# Patient Record
Sex: Female | Born: 1935 | Race: White | Hispanic: No | Marital: Single | Smoking: Never smoker
Health system: Southern US, Community
[De-identification: ages and names within clinical notes are randomized; demographics above are authoritative.]

## PROBLEM LIST (undated history)

## (undated) DIAGNOSIS — I10 Essential (primary) hypertension: Secondary | ICD-10-CM

## (undated) DIAGNOSIS — E119 Type 2 diabetes mellitus without complications: Secondary | ICD-10-CM

---

## 2013-01-01 ENCOUNTER — Inpatient Hospital Stay (HOSPITAL_COMMUNITY)
Admission: EM | Admit: 2013-01-01 | Discharge: 2013-01-06 | DRG: 956 | Disposition: A | Discharge status: Skilled Nursing Facility | Payer: Medicare Other | Attending: Internal Medicine | Admitting: Internal Medicine

## 2013-01-01 ENCOUNTER — Encounter (HOSPITAL_COMMUNITY): Payer: Self-pay | Admitting: Emergency Medicine

## 2013-01-01 ENCOUNTER — Emergency Department (HOSPITAL_COMMUNITY): Payer: Medicare Other

## 2013-01-01 DIAGNOSIS — S32509A Unspecified fracture of unspecified pubis, initial encounter for closed fracture: Secondary | ICD-10-CM | POA: Diagnosis present

## 2013-01-01 DIAGNOSIS — S72141P Displaced intertrochanteric fracture of right femur, subsequent encounter for closed fracture with malunion: Secondary | ICD-10-CM

## 2013-01-01 DIAGNOSIS — M6282 Rhabdomyolysis: Secondary | ICD-10-CM | POA: Diagnosis present

## 2013-01-01 DIAGNOSIS — Z66 Do not resuscitate: Secondary | ICD-10-CM | POA: Diagnosis present

## 2013-01-01 DIAGNOSIS — S72143A Displaced intertrochanteric fracture of unspecified femur, initial encounter for closed fracture: Secondary | ICD-10-CM

## 2013-01-01 DIAGNOSIS — R739 Hyperglycemia, unspecified: Secondary | ICD-10-CM | POA: Diagnosis present

## 2013-01-01 DIAGNOSIS — R300 Dysuria: Secondary | ICD-10-CM | POA: Diagnosis present

## 2013-01-01 DIAGNOSIS — M62838 Other muscle spasm: Secondary | ICD-10-CM | POA: Diagnosis not present

## 2013-01-01 DIAGNOSIS — D62 Acute posthemorrhagic anemia: Secondary | ICD-10-CM | POA: Diagnosis not present

## 2013-01-01 DIAGNOSIS — E119 Type 2 diabetes mellitus without complications: Secondary | ICD-10-CM | POA: Diagnosis present

## 2013-01-01 DIAGNOSIS — Z801 Family history of malignant neoplasm of trachea, bronchus and lung: Secondary | ICD-10-CM

## 2013-01-01 DIAGNOSIS — Z7982 Long term (current) use of aspirin: Secondary | ICD-10-CM

## 2013-01-01 DIAGNOSIS — S7290XA Unspecified fracture of unspecified femur, initial encounter for closed fracture: Secondary | ICD-10-CM

## 2013-01-01 DIAGNOSIS — E559 Vitamin D deficiency, unspecified: Secondary | ICD-10-CM | POA: Diagnosis present

## 2013-01-01 DIAGNOSIS — IMO0001 Reserved for inherently not codable concepts without codable children: Secondary | ICD-10-CM | POA: Diagnosis present

## 2013-01-01 DIAGNOSIS — S72141A Displaced intertrochanteric fracture of right femur, initial encounter for closed fracture: Secondary | ICD-10-CM

## 2013-01-01 DIAGNOSIS — W19XXXA Unspecified fall, initial encounter: Secondary | ICD-10-CM | POA: Diagnosis present

## 2013-01-01 DIAGNOSIS — Z79899 Other long term (current) drug therapy: Secondary | ICD-10-CM

## 2013-01-01 DIAGNOSIS — I10 Essential (primary) hypertension: Secondary | ICD-10-CM | POA: Diagnosis present

## 2013-01-01 DIAGNOSIS — D72829 Elevated white blood cell count, unspecified: Secondary | ICD-10-CM

## 2013-01-01 HISTORY — DX: Essential (primary) hypertension: I10

## 2013-01-01 HISTORY — DX: Type 2 diabetes mellitus without complications: E11.9

## 2013-01-01 LAB — TYPE AND SCREEN
ABO/RH(D): O POS
Antibody Screen: NEGATIVE

## 2013-01-01 LAB — BASIC METABOLIC PANEL
CO2: 21 mEq/L (ref 19–32)
Calcium: 8.8 mg/dL (ref 8.4–10.5)
Creatinine, Ser: 0.65 mg/dL (ref 0.50–1.10)
GFR calc Af Amer: 89 mL/min — ABNORMAL LOW (ref >90)
GFR calc non Af Amer: 76 mL/min — ABNORMAL LOW (ref >90)
Glucose, Bld: 231 mg/dL — ABNORMAL HIGH (ref 70–99)
Sodium: 138 mEq/L (ref 135–145)

## 2013-01-01 LAB — CBC WITH DIFFERENTIAL/PLATELET
Basophils Absolute: 0 10*3/uL (ref 0.0–0.1)
Basophils Relative: 0 % (ref 0–1)
Eosinophils Absolute: 0 10*3/uL (ref 0.0–0.7)
Eosinophils Relative: 0 % (ref 0–5)
HCT: 36.5 % (ref 36.0–46.0)
Lymphs Abs: 0.8 10*3/uL (ref 0.7–4.0)
MCH: 28.6 pg (ref 26.0–34.0)
MCV: 84.3 fL (ref 78.0–100.0)
Monocytes Absolute: 1.7 10*3/uL — ABNORMAL HIGH (ref 0.1–1.0)
Platelets: 232 10*3/uL (ref 150–400)
RBC: 4.33 MIL/uL (ref 3.87–5.11)
RDW: 14.2 % (ref 11.5–15.5)

## 2013-01-01 LAB — PROTIME-INR: INR: 1.11 (ref 0.00–1.49)

## 2013-01-01 MED ORDER — TETANUS-DIPHTH-ACELL PERTUSSIS 5-2.5-18.5 LF-MCG/0.5 IM SUSP
0.5000 mL | Freq: Once | INTRAMUSCULAR | Status: AC
  Administered 2013-01-02: 0.5 mL via INTRAMUSCULAR
  Filled 2013-01-01: qty 0.5

## 2013-01-01 NOTE — ED Notes (Addendum)
Per PT[facility], pt had an un-wittnessed fall at home with unknown downtime, pt has shortening and outward rotation of right leg. Pt has skin tear on right forearm, and small wound above her right eye.

## 2013-01-01 NOTE — ED Provider Notes (Signed)
CSN: [id number]     Arrival date & time 01/01/13  2224 History   First MD Initiated Contact with Patient 01/01/13 2244     Chief Complaint  Patient presents with  . Fall  . Hip Pain   (Consider location/radiation/quality/duration/timing/severity/associated sxs/prior Treatment) HPI Comments: Patient is an 79 year old female with history of diabetes, hypertension who presents today after a fall. She is brought in by EMS after her family found her on the ground. When they found her they report that she said "I just wanted to lay down and watch TV". She complains of no pain. The family last spoke to the patient on Monday when she was behaving normally. They were unable to contact her yesterday. She lives at home alone. She has baseline dementia and her family reports that she is acting at her normal mental status. Her family does not believe that her tetanus shot is up to date. She is currently taking no medications. She has had no real medical care in the past 2 years since she moved here from [place].   Patient is a 79 y.o. female presenting with fall and hip pain. The history is provided by the patient and a relative.  Fall  Hip Pain    History reviewed. No pertinent past medical history. History reviewed. No pertinent past surgical history. No family history on file. History  Substance Use Topics  . Smoking status: Never Smoker   . Smokeless tobacco: Not on file  . Alcohol Use: No   OB History   Grav Para Term Preterm Abortions TAB SAB Ect Mult Living                 Review of Systems  Unable to perform ROS: Dementia    Allergies  Review of patient's allergies indicates no known allergies.  Home Medications  No current outpatient prescriptions on file. BP 158/58  Pulse 86  Temp(Src) 97.5 F (36.4 C) (Oral)  Resp 18  SpO2 98% Physical Exam  Nursing note and vitals reviewed. Constitutional: She appears well-developed and well-nourished. No distress.  HENT:  Head:  Normocephalic. Head is with abrasion.  Right Ear: External ear normal.  Left Ear: External ear normal.  Nose: Nose normal.  Mouth/Throat: Uvula is midline and oropharynx is clear and moist. Mucous membranes are dry.  Dried blood on tongue. No laceration appreciated.  Poor dentition.  Abrasion to right occiput. Large mass to left mastoid area consistent with skin CA. Appears chronic. Scabbing top layer has been torn off and there is bleeding, which is controlled.  Patient is very hard of hearing.   Eyes: Conjunctivae and EOM are normal. Pupils are equal, round, and reactive to light.  Neck: Trachea normal, normal range of motion and phonation normal. No spinous process tenderness and no muscular tenderness present. No rigidity. Normal range of motion present.  Cardiovascular: Normal rate, regular rhythm, normal heart sounds, intact distal pulses and normal pulses.   Pulses:      Radial pulses are 2+ on the right side, and 2+ on the left side.       Dorsalis pedis pulses are 2+ on the right side, and 2+ on the left side.  Pulmonary/Chest: Effort normal and breath sounds normal. No stridor. No respiratory distress. She has no wheezes. She has no rales.  Abdominal: Soft. She exhibits no distension. There is no tenderness.  Musculoskeletal: Normal range of motion.       Legs: Right leg is externally rotated and shortened. TTP over  right hip.   Neurological: She is alert. Coordination normal.  Confused, but cooperative.   Skin: Skin is warm and dry. She is not diaphoretic. No erythema.  Psychiatric: She has a normal mood and affect. Her behavior is normal. Cognition and memory are impaired.  confabulates    ED Course  Procedures (including critical care time) Labs Review Labs Reviewed  CBC WITH DIFFERENTIAL - Abnormal; Notable for the following:    WBC 24.9 (*)    Neutrophils Relative % 90 (*)    Neutro Abs 22.4 (*)    Lymphocytes Relative 3 (*)    Monocytes Absolute 1.7 (*)    All  other components within normal limits  BASIC METABOLIC PANEL - Abnormal; Notable for the following:    Glucose, Bld 231 (*)    GFR calc non Af Amer 76 (*)    GFR calc Af Amer 89 (*)    All other components within normal limits  CK - Abnormal; Notable for the following:    Total CK 875 (*)    All other components within normal limits  URINALYSIS, ROUTINE W REFLEX MICROSCOPIC - Abnormal; Notable for the following:    APPearance CLOUDY (*)    Glucose, UA 500 (*)    Ketones, ur 40 (*)    All other components within normal limits  CULTURE, BLOOD (ROUTINE X 2)  CULTURE, BLOOD (ROUTINE X 2)  PROTIME-INR  HEMOGLOBIN A1C  TYPE AND SCREEN  ABO/RH   Imaging Review Dg Hip Complete Right  01/02/2013   CLINICAL DATA:  Fall with right hip pain.  EXAM: RIGHT HIP - COMPLETE 2+ VIEW  COMPARISON:  None.  FINDINGS: Moderate osteopenia. Degenerative irregularity of the symphysis pubis.  Comminuted inter trochanteric right femur fracture, with separate lesser trochanteric fracture fragment.  Possible minimally displaced right inferior pubic ramus fracture.  IMPRESSION: Comminuted intertrochanteric right femur fracture.  Possible right inferior pubic ramus minimally displaced fracture.   Electronically Signed   By: Jeronimo Greaves M.D.   On: 01/02/2013 00:27   Ct Head Wo Contrast  01/02/2013   CLINICAL DATA:  Fall at home.  Injury to right eye.  EXAM: CT HEAD WITHOUT CONTRAST  CT CERVICAL SPINE WITHOUT CONTRAST  TECHNIQUE: Multidetector CT imaging of the head and cervical spine was performed following the standard protocol without intravenous contrast. Multiplanar CT image reconstructions of the cervical spine were also generated.  COMPARISON:  None.  FINDINGS: CT HEAD FINDINGS  Sinuses/Soft tissues: Right supraorbital soft tissue injury. Normal appearance of the orbits and globes, without retrobulbar hemorrhage. There is also soft tissue swelling about the posterior vertex on image 56/series 5 and possible soft  tissue swelling about the high left frontal scalp on image 54/series 4.  No underlying orbital fracture. Hyperostosis frontalis interna. No skull fracture. Clear mastoid air cells. Mucosal thickening of ethmoid air cells.  Intracranial: Expected cerebral atrophy. No mass lesion, hemorrhage, hydrocephalus, acute infarct, intra-axial, or extra-axial fluid collection.  CT CERVICAL SPINE FINDINGS  Spinal visualization through the bottom of T3. Prevertebral soft tissues are within normal limits. No apical pneumothorax.  Nodule in the left parotid gland which measures 1.3 cm on image 42/series 7  Cervical spondylosis with areas of bilateral neural foraminal narrowing, primarily at C5-6.  Skull base intact. Maintenance of vertebral body height and alignment. Facets are well-aligned. Coronal reformats demonstrate a normal C1-C2 articulation.  IMPRESSION: 1. Right supraorbital and scalp soft tissue swelling without acute intracranial abnormality. 2. No acute fracture or subluxation within the  cervical spine. 3. A left parotid gland nodule which is indeterminate. Of questionable clinical significance, given patient age.   Electronically Signed   By: Jeronimo Greaves M.D.   On: 01/02/2013 00:25   Ct Cervical Spine Wo Contrast  01/02/2013   CLINICAL DATA:  Fall at home.  Injury to right eye.  EXAM: CT HEAD WITHOUT CONTRAST  CT CERVICAL SPINE WITHOUT CONTRAST  TECHNIQUE: Multidetector CT imaging of the head and cervical spine was performed following the standard protocol without intravenous contrast. Multiplanar CT image reconstructions of the cervical spine were also generated.  COMPARISON:  None.  FINDINGS: CT HEAD FINDINGS  Sinuses/Soft tissues: Right supraorbital soft tissue injury. Normal appearance of the orbits and globes, without retrobulbar hemorrhage. There is also soft tissue swelling about the posterior vertex on image 56/series 5 and possible soft tissue swelling about the high left frontal scalp on image 54/series  4.  No underlying orbital fracture. Hyperostosis frontalis interna. No skull fracture. Clear mastoid air cells. Mucosal thickening of ethmoid air cells.  Intracranial: Expected cerebral atrophy. No mass lesion, hemorrhage, hydrocephalus, acute infarct, intra-axial, or extra-axial fluid collection.  CT CERVICAL SPINE FINDINGS  Spinal visualization through the bottom of T3. Prevertebral soft tissues are within normal limits. No apical pneumothorax.  Nodule in the left parotid gland which measures 1.3 cm on image 42/series 7  Cervical spondylosis with areas of bilateral neural foraminal narrowing, primarily at C5-6.  Skull base intact. Maintenance of vertebral body height and alignment. Facets are well-aligned. Coronal reformats demonstrate a normal C1-C2 articulation.  IMPRESSION: 1. Right supraorbital and scalp soft tissue swelling without acute intracranial abnormality. 2. No acute fracture or subluxation within the cervical spine. 3. A left parotid gland nodule which is indeterminate. Of questionable clinical significance, given patient age.   Electronically Signed   By: Jeronimo Greaves M.D.   On: 01/02/2013 00:25   Dg Chest Portable 1 View  01/02/2013   CLINICAL DATA:  Fall with hip injury.  Leukocytosis.  EXAM: PORTABLE CHEST - 1 VIEW  COMPARISON:  None.  FINDINGS: Osteopenia.  Patient rotated to the right. Artifact over the upper chest. Mild cardiomegaly. No pleural effusion or pneumothorax. No lobar consolidation. Lower lobe and peripheral predominant interstitial opacities.  IMPRESSION: Lower lobe and peripheral predominant interstitial opacities, suspicious for interstitial lung disease, likely pulmonary fibrosis.  No acute superimposed process.   Electronically Signed   By: Jeronimo Greaves M.D.   On: 01/02/2013 01:53    EKG Interpretation    Date/Time:  Thursday January 02 2013 00:16:27 EST Ventricular Rate:  87 PR Interval:  175 QRS Duration: 119 QT Interval:  377 QTC Calculation: 453 R  Axis:   -35 Text Interpretation:  Sinus rhythm Atrial premature complex LVH with IVCD and secondary repol abnrm Left axis deviation No old tracing to compare Confirmed by [facility]  MD, DAVID ([id number]) on 01/02/2013 12:21:23 AM           Discussed case with Dr. Roda Shutters who will see patient in the am. Admit to medicine.   MDM  No diagnosis found.  Presented after fall.. The patient was down is unknown. Last seen well on Monday. For this reason a CK was obtained. CK is 875. Fluid resuscitation was given. Patient with comminuted right intertrochanteric femur fracture. Orthopedics was counseled. They will see the patient tomorrow morning. Patient is admitted to medicine. Admission is appreciated. Vital signs are stable at this time. Patient has no complaints. Dr. Preston Fleeting has evaluated this patient and  agrees with plan. Patient / Family / Caregiver informed of clinical course, understand medical decision-making process, and agree with plan.     Mora Bellman, PA-C 01/02/13 [phone]

## 2013-01-02 ENCOUNTER — Encounter (HOSPITAL_COMMUNITY)
Admission: EM | Disposition: A | Discharge status: Skilled Nursing Facility | Payer: Self-pay | Source: Home / Self Care | Attending: Internal Medicine

## 2013-01-02 ENCOUNTER — Inpatient Hospital Stay (HOSPITAL_COMMUNITY): Payer: Medicare Other

## 2013-01-02 ENCOUNTER — Encounter (HOSPITAL_COMMUNITY): Payer: Medicare Other | Admitting: Anesthesiology

## 2013-01-02 ENCOUNTER — Inpatient Hospital Stay (HOSPITAL_COMMUNITY): Payer: Medicare Other | Admitting: Anesthesiology

## 2013-01-02 ENCOUNTER — Encounter (HOSPITAL_COMMUNITY): Payer: Self-pay | Admitting: Radiology

## 2013-01-02 DIAGNOSIS — IMO0001 Reserved for inherently not codable concepts without codable children: Secondary | ICD-10-CM | POA: Diagnosis present

## 2013-01-02 DIAGNOSIS — S7290XA Unspecified fracture of unspecified femur, initial encounter for closed fracture: Secondary | ICD-10-CM

## 2013-01-02 DIAGNOSIS — D72829 Elevated white blood cell count, unspecified: Secondary | ICD-10-CM

## 2013-01-02 DIAGNOSIS — R739 Hyperglycemia, unspecified: Secondary | ICD-10-CM | POA: Diagnosis present

## 2013-01-02 DIAGNOSIS — R030 Elevated blood-pressure reading, without diagnosis of hypertension: Secondary | ICD-10-CM

## 2013-01-02 DIAGNOSIS — R7309 Other abnormal glucose: Secondary | ICD-10-CM

## 2013-01-02 DIAGNOSIS — S72143A Displaced intertrochanteric fracture of unspecified femur, initial encounter for closed fracture: Principal | ICD-10-CM

## 2013-01-02 HISTORY — PX: INTRAMEDULLARY (IM) NAIL INTERTROCHANTERIC: SHX5875

## 2013-01-02 LAB — URINALYSIS, ROUTINE W REFLEX MICROSCOPIC
Bilirubin Urine: NEGATIVE
Glucose, UA: 500 mg/dL — AB
Ketones, ur: 40 mg/dL — AB
Leukocytes, UA: NEGATIVE
Nitrite: NEGATIVE
Protein, ur: NEGATIVE mg/dL
Urobilinogen, UA: 0.2 mg/dL (ref 0.0–1.0)
pH: 5.5 (ref 5.0–8.0)

## 2013-01-02 LAB — COMPREHENSIVE METABOLIC PANEL
ALT: 17 U/L (ref 0–35)
AST: 36 U/L (ref 0–37)
Albumin: 2.9 g/dL — ABNORMAL LOW (ref 3.5–5.2)
Alkaline Phosphatase: 86 U/L (ref 39–117)
BUN: 12 mg/dL (ref 6–23)
Chloride: 107 mEq/L (ref 96–112)
GFR calc Af Amer: >90 mL/min (ref >90)
Glucose, Bld: 208 mg/dL — ABNORMAL HIGH (ref 70–99)
Potassium: 3.8 mEq/L (ref 3.5–5.1)
Sodium: 140 mEq/L (ref 135–145)
Total Bilirubin: 0.9 mg/dL (ref 0.3–1.2)
Total Protein: 6.6 g/dL (ref 6.0–8.3)

## 2013-01-02 LAB — HEMOGLOBIN A1C
Hgb A1c MFr Bld: 7.6 % — ABNORMAL HIGH (ref <5.7)
Mean Plasma Glucose: 171 mg/dL — ABNORMAL HIGH (ref <117)

## 2013-01-02 LAB — GLUCOSE, CAPILLARY
Glucose-Capillary: 202 mg/dL — ABNORMAL HIGH (ref 70–99)
Glucose-Capillary: 176 mg/dL — ABNORMAL HIGH (ref 70–99)
Glucose-Capillary: 212 mg/dL — ABNORMAL HIGH (ref 70–99)

## 2013-01-02 LAB — APTT: aPTT: 34 seconds (ref 24–37)

## 2013-01-02 LAB — SURGICAL PCR SCREEN: MRSA, PCR: NEGATIVE

## 2013-01-02 LAB — ABO/RH: ABO/RH(D): O POS

## 2013-01-02 SURGERY — FIXATION, FRACTURE, INTERTROCHANTERIC, WITH INTRAMEDULLARY ROD
Anesthesia: General | Site: Hip | Laterality: Right

## 2013-01-02 MED ORDER — SORBITOL 70 % SOLN: 30.0000 mL | Freq: Every day | Status: DC | PRN

## 2013-01-02 MED ORDER — METHOCARBAMOL 500 MG PO TABS
500.0000 mg | ORAL_TABLET | Freq: Four times a day (QID) | ORAL | Status: DC | PRN
  Administered 2013-01-03 – 2013-01-04 (×2): 500 mg via ORAL
  Filled 2013-01-02 (×2): qty 1

## 2013-01-02 MED ORDER — MENTHOL 3 MG MT LOZG: 1.0000 | LOZENGE | OROMUCOSAL | Status: DC | PRN

## 2013-01-02 MED ORDER — ONDANSETRON HCL 4 MG/2ML IJ SOLN
INTRAMUSCULAR | Status: DC | PRN
  Administered 2013-01-02: 4 mg via INTRAVENOUS

## 2013-01-02 MED ORDER — INSULIN ASPART 100 UNIT/ML ~~LOC~~ SOLN
0.0000 [IU] | Freq: Three times a day (TID) | SUBCUTANEOUS | Status: DC
  Administered 2013-01-02: 1 [IU] via SUBCUTANEOUS
  Administered 2013-01-02 – 2013-01-03 (×3): 2 [IU] via SUBCUTANEOUS
  Administered 2013-01-03: 3 [IU] via SUBCUTANEOUS
  Administered 2013-01-04: 1 [IU] via SUBCUTANEOUS

## 2013-01-02 MED ORDER — MORPHINE SULFATE 2 MG/ML IJ SOLN
0.5000 mg | INTRAMUSCULAR | Status: DC | PRN
  Administered 2013-01-03 (×2): 0.5 mg via INTRAVENOUS
  Filled 2013-01-02 (×2): qty 1

## 2013-01-02 MED ORDER — CEFAZOLIN SODIUM-DEXTROSE 2-3 GM-% IV SOLR
2.0000 g | Freq: Four times a day (QID) | INTRAVENOUS | Status: AC
  Administered 2013-01-02 – 2013-01-03 (×3): 2 g via INTRAVENOUS
  Filled 2013-01-02 (×3): qty 50

## 2013-01-02 MED ORDER — METHOCARBAMOL 100 MG/ML IJ SOLN
500.0000 mg | Freq: Four times a day (QID) | INTRAVENOUS | Status: DC | PRN
  Filled 2013-01-02: qty 5

## 2013-01-02 MED ORDER — FENTANYL CITRATE 0.05 MG/ML IJ SOLN
INTRAMUSCULAR | Status: DC | PRN
  Administered 2013-01-02 (×3): 50 ug via INTRAVENOUS

## 2013-01-02 MED ORDER — NEOSTIGMINE METHYLSULFATE 1 MG/ML IJ SOLN
INTRAMUSCULAR | Status: DC | PRN
  Administered 2013-01-02: 4 mg via INTRAVENOUS

## 2013-01-02 MED ORDER — SENNA 8.6 MG PO TABS
1.0000 | ORAL_TABLET | Freq: Two times a day (BID) | ORAL | Status: DC
  Administered 2013-01-02 – 2013-01-06 (×8): 8.6 mg via ORAL
  Filled 2013-01-02 (×9): qty 1

## 2013-01-02 MED ORDER — HYDROCODONE-ACETAMINOPHEN 5-325 MG PO TABS: 1.0000 | ORAL_TABLET | Freq: Four times a day (QID) | ORAL | Status: DC | PRN

## 2013-01-02 MED ORDER — HYDRALAZINE HCL 20 MG/ML IJ SOLN: 10.0000 mg | INTRAMUSCULAR | Status: DC | PRN

## 2013-01-02 MED ORDER — LIDOCAINE HCL (CARDIAC) 20 MG/ML IV SOLN
INTRAVENOUS | Status: DC | PRN
  Administered 2013-01-02: 20 mg via INTRAVENOUS

## 2013-01-02 MED ORDER — SODIUM CHLORIDE 0.9 % IV SOLN
INTRAVENOUS | Status: DC
  Administered 2013-01-02 – 2013-01-05 (×4): via INTRAVENOUS

## 2013-01-02 MED ORDER — DROPERIDOL 2.5 MG/ML IJ SOLN: 0.6250 mg | INTRAMUSCULAR | Status: DC | PRN

## 2013-01-02 MED ORDER — 0.9 % SODIUM CHLORIDE (POUR BTL) OPTIME
TOPICAL | Status: DC | PRN
  Administered 2013-01-02: 1000 mL

## 2013-01-02 MED ORDER — MORPHINE SULFATE 2 MG/ML IJ SOLN
0.5000 mg | INTRAMUSCULAR | Status: DC | PRN
  Administered 2013-01-02 (×2): 0.5 mg via INTRAVENOUS
  Filled 2013-01-02 (×2): qty 1

## 2013-01-02 MED ORDER — ASPIRIN EC 325 MG PO TBEC
325.0000 mg | DELAYED_RELEASE_TABLET | Freq: Two times a day (BID) | ORAL | Status: DC
  Administered 2013-01-02 – 2013-01-06 (×8): 325 mg via ORAL
  Filled 2013-01-02 (×10): qty 1

## 2013-01-02 MED ORDER — METOCLOPRAMIDE HCL 5 MG/ML IJ SOLN: 5.0000 mg | Freq: Three times a day (TID) | INTRAMUSCULAR | Status: DC | PRN

## 2013-01-02 MED ORDER — METOCLOPRAMIDE HCL 10 MG PO TABS: 5.0000 mg | ORAL_TABLET | Freq: Three times a day (TID) | ORAL | Status: DC | PRN

## 2013-01-02 MED ORDER — PHENOL 1.4 % MT LIQD: 1.0000 | OROMUCOSAL | Status: DC | PRN

## 2013-01-02 MED ORDER — CALCIUM CARBONATE-VITAMIN D 500-200 MG-UNIT PO TABS
1.0000 | ORAL_TABLET | Freq: Every day | ORAL | Status: DC
  Administered 2013-01-03 – 2013-01-06 (×4): 1 via ORAL
  Filled 2013-01-02 (×5): qty 1

## 2013-01-02 MED ORDER — MAGNESIUM CITRATE PO SOLN: 1.0000 | Freq: Once | ORAL | Status: AC | PRN

## 2013-01-02 MED ORDER — CEFAZOLIN SODIUM 1-5 GM-% IV SOLN
INTRAVENOUS | Status: DC | PRN
  Administered 2013-01-02: 2 g via INTRAVENOUS

## 2013-01-02 MED ORDER — FENTANYL CITRATE 0.05 MG/ML IJ SOLN
25.0000 ug | INTRAMUSCULAR | Status: DC | PRN
  Administered 2013-01-02: 25 ug via INTRAVENOUS

## 2013-01-02 MED ORDER — OXYCODONE HCL 5 MG PO TABS
5.0000 mg | ORAL_TABLET | ORAL | Status: DC | PRN
  Administered 2013-01-03: 10 mg via ORAL
  Filled 2013-01-02: qty 2

## 2013-01-02 MED ORDER — ONDANSETRON HCL 4 MG/2ML IJ SOLN: 4.0000 mg | Freq: Four times a day (QID) | INTRAMUSCULAR | Status: DC | PRN

## 2013-01-02 MED ORDER — GLYCOPYRROLATE 0.2 MG/ML IJ SOLN
INTRAMUSCULAR | Status: DC | PRN
  Administered 2013-01-02: .6 mg via INTRAVENOUS

## 2013-01-02 MED ORDER — ASPIRIN EC 325 MG PO TBEC: 325.0000 mg | DELAYED_RELEASE_TABLET | Freq: Two times a day (BID) | ORAL | Status: DC

## 2013-01-02 MED ORDER — PNEUMOCOCCAL VAC POLYVALENT 25 MCG/0.5ML IJ INJ
0.5000 mL | INJECTION | INTRAMUSCULAR | Status: AC
  Administered 2013-01-03: 0.5 mL via INTRAMUSCULAR
  Filled 2013-01-02: qty 0.5

## 2013-01-02 MED ORDER — SODIUM CHLORIDE 0.9 % IV BOLUS (SEPSIS): 1000.0000 mL | Freq: Once | INTRAVENOUS | Status: DC

## 2013-01-02 MED ORDER — PROPOFOL 10 MG/ML IV BOLUS
INTRAVENOUS | Status: DC | PRN
  Administered 2013-01-02: 130 mg via INTRAVENOUS

## 2013-01-02 MED ORDER — CEFAZOLIN SODIUM 1-5 GM-% IV SOLN
INTRAVENOUS | Status: AC
  Filled 2013-01-02: qty 100

## 2013-01-02 MED ORDER — ACETAMINOPHEN 650 MG RE SUPP: 650.0000 mg | Freq: Four times a day (QID) | RECTAL | Status: DC | PRN

## 2013-01-02 MED ORDER — ROCURONIUM BROMIDE 100 MG/10ML IV SOLN
INTRAVENOUS | Status: DC | PRN
  Administered 2013-01-02: 30 mg via INTRAVENOUS

## 2013-01-02 MED ORDER — ALUM & MAG HYDROXIDE-SIMETH 200-200-20 MG/5ML PO SUSP: 30.0000 mL | ORAL | Status: DC | PRN

## 2013-01-02 MED ORDER — MORPHINE SULFATE 2 MG/ML IJ SOLN: 0.5000 mg | INTRAMUSCULAR | Status: DC | PRN

## 2013-01-02 MED ORDER — LACTATED RINGERS IV SOLN
INTRAVENOUS | Status: DC | PRN
  Administered 2013-01-02 (×2): via INTRAVENOUS

## 2013-01-02 MED ORDER — POLYETHYLENE GLYCOL 3350 17 G PO PACK: 17.0000 g | PACK | Freq: Every day | ORAL | Status: DC | PRN

## 2013-01-02 MED ORDER — ENOXAPARIN SODIUM 40 MG/0.4ML ~~LOC~~ SOLN: 40.0000 mg | SUBCUTANEOUS | Status: DC

## 2013-01-02 MED ORDER — HYDROCODONE-ACETAMINOPHEN 5-325 MG PO TABS
1.0000 | ORAL_TABLET | Freq: Four times a day (QID) | ORAL | Status: DC | PRN
  Administered 2013-01-03 – 2013-01-04 (×2): 2 via ORAL
  Filled 2013-01-02 (×2): qty 2

## 2013-01-02 MED ORDER — ONDANSETRON HCL 4 MG PO TABS: 4.0000 mg | ORAL_TABLET | Freq: Four times a day (QID) | ORAL | Status: DC | PRN

## 2013-01-02 MED ORDER — SODIUM CHLORIDE 0.9 % IV BOLUS (SEPSIS)
1000.0000 mL | Freq: Once | INTRAVENOUS | Status: AC
  Administered 2013-01-02: 1000 mL via INTRAVENOUS

## 2013-01-02 MED ORDER — FENTANYL CITRATE 0.05 MG/ML IJ SOLN
INTRAMUSCULAR | Status: AC
  Filled 2013-01-02: qty 2

## 2013-01-02 MED ORDER — SODIUM CHLORIDE 0.9 % IV SOLN
INTRAVENOUS | Status: AC
  Administered 2013-01-02: 04:00:00 via INTRAVENOUS

## 2013-01-02 MED ORDER — ACETAMINOPHEN 325 MG PO TABS
650.0000 mg | ORAL_TABLET | Freq: Four times a day (QID) | ORAL | Status: DC | PRN
  Administered 2013-01-06: 650 mg via ORAL
  Filled 2013-01-02: qty 2

## 2013-01-02 MED ORDER — ARTIFICIAL TEARS OP OINT
TOPICAL_OINTMENT | OPHTHALMIC | Status: DC | PRN
  Administered 2013-01-02: 1 via OPHTHALMIC

## 2013-01-02 SURGICAL SUPPLY — 45 items
BANDAGE GAUZE ELAST BULKY 4 IN (GAUZE/BANDAGES/DRESSINGS) ×2 IMPLANT
BLADE SURG 15 STRL LF DISP TIS (BLADE) ×1 IMPLANT
BLADE SURG 15 STRL SS (BLADE) ×1
BNDG COHESIVE 4X5 TAN NS LF (GAUZE/BANDAGES/DRESSINGS) ×2 IMPLANT
CLOTH BEACON ORANGE TIMEOUT ST (SAFETY) ×2 IMPLANT
COVER SURGICAL LIGHT HANDLE (MISCELLANEOUS) ×2 IMPLANT
DRAPE PROXIMA HALF (DRAPES) ×4 IMPLANT
DRAPE STERI IOBAN 125X83 (DRAPES) ×2 IMPLANT
DRSG MEPILEX BORDER 4X4 (GAUZE/BANDAGES/DRESSINGS) IMPLANT
DRSG MEPILEX BORDER 4X8 (GAUZE/BANDAGES/DRESSINGS) ×2 IMPLANT
DRSG PAD ABDOMINAL 8X10 ST (GAUZE/BANDAGES/DRESSINGS) ×4 IMPLANT
DURAPREP 26ML APPLICATOR (WOUND CARE) ×2 IMPLANT
ELECT REM PT RETURN 9FT ADLT (ELECTROSURGICAL) ×2
ELECTRODE REM PT RTRN 9FT ADLT (ELECTROSURGICAL) ×1 IMPLANT
FACESHIELD LNG OPTICON STERILE (SAFETY) ×2 IMPLANT
GAUZE XEROFORM 5X9 LF (GAUZE/BANDAGES/DRESSINGS) ×2 IMPLANT
GLOVE SURG SS PI 7.5 STRL IVOR (GLOVE) ×4 IMPLANT
GOWN STRL NON-REIN LRG LVL3 (GOWN DISPOSABLE) ×2 IMPLANT
GOWN STRL REIN XL XLG (GOWN DISPOSABLE) ×2 IMPLANT
GUIDE PIN 3.2 LONG (PIN) ×2 IMPLANT
GUIDE PIN 3.2MM (MISCELLANEOUS) ×1
GUIDE PIN ORTH 343X3.2XBRAD (MISCELLANEOUS) ×1 IMPLANT
GUIDE ROD 3.0 (MISCELLANEOUS) ×2
HIP SCREW SET (Screw) ×2 IMPLANT
KIT BASIN OR (CUSTOM PROCEDURE TRAY) ×2 IMPLANT
KIT ROOM TURNOVER OR (KITS) ×2 IMPLANT
MANIFOLD NEPTUNE II (INSTRUMENTS) ×2 IMPLANT
NAIL IMHS 12MX36CM-130 (Nail) ×2 IMPLANT
NS IRRIG 1000ML POUR BTL (IV SOLUTION) ×2 IMPLANT
PACK GENERAL/GYN (CUSTOM PROCEDURE TRAY) ×2 IMPLANT
PAD ARMBOARD 7.5X6 YLW CONV (MISCELLANEOUS) ×4 IMPLANT
PAD CAST 4YDX4 CTTN HI CHSV (CAST SUPPLIES) ×2 IMPLANT
PADDING CAST COTTON 4X4 STRL (CAST SUPPLIES) ×2
ROD GUIDE 3.0 (MISCELLANEOUS) ×1 IMPLANT
SCREW COMPRESSION (Screw) ×2 IMPLANT
SCREW LAG 95MM (Screw) ×1 IMPLANT
SCREW LAGSTD 95X21X12.7X9 (Screw) ×1 IMPLANT
STAPLER VISISTAT 35W (STAPLE) ×2 IMPLANT
SUT VIC AB 0 CT1 27 (SUTURE) ×2
SUT VIC AB 0 CT1 27XBRD ANBCTR (SUTURE) ×2 IMPLANT
SUT VIC AB 2-0 CT1 27 (SUTURE) ×2
SUT VIC AB 2-0 CT1 TAPERPNT 27 (SUTURE) ×2 IMPLANT
TOWEL OR 17X24 6PK STRL BLUE (TOWEL DISPOSABLE) ×2 IMPLANT
TOWEL OR 17X26 10 PK STRL BLUE (TOWEL DISPOSABLE) ×2 IMPLANT
WATER STERILE IRR 1000ML POUR (IV SOLUTION) ×2 IMPLANT

## 2013-01-02 NOTE — Consult Note (Signed)
ORTHOPAEDIC CONSULTATION  REQUESTING PHYSICIAN: Eddie North, MD  Chief Complaint: Right hip pain  HPI: Kaitlin Brennan is a 79 y.o. female who complains of right hip fracture s/p being found on the floor by son.  She has a significant laceration to the back of the head.  Denies LOC.  Past Medical History  Diagnosis Date  . Diabetes mellitus without complication   . Hypertension    History reviewed. No pertinent past surgical history. History   Social History  . Marital Status: Single    Spouse Name: N/A    Number of Children: N/A  . Years of Education: N/A   Social History Main Topics  . Smoking status: Never Smoker   . Smokeless tobacco: None  . Alcohol Use: No  . Drug Use: No  . Sexual Activity: No   Other Topics Concern  . None   Social History Narrative  . None   Family History  Problem Relation Age of Onset  . Lung cancer Father    No Known Allergies Prior to Admission medications   Not on File   Dg Hip Complete Right  01/02/2013   CLINICAL DATA:  Fall with right hip pain.  EXAM: RIGHT HIP - COMPLETE 2+ VIEW  COMPARISON:  None.  FINDINGS: Moderate osteopenia. Degenerative irregularity of the symphysis pubis.  Comminuted inter trochanteric right femur fracture, with separate lesser trochanteric fracture fragment.  Possible minimally displaced right inferior pubic ramus fracture.  IMPRESSION: Comminuted intertrochanteric right femur fracture.  Possible right inferior pubic ramus minimally displaced fracture.   Electronically Signed   By: Jeronimo Greaves M.D.   On: 01/02/2013 00:27   Ct Head Wo Contrast  01/02/2013   CLINICAL DATA:  Fall at home.  Injury to right eye.  EXAM: CT HEAD WITHOUT CONTRAST  CT CERVICAL SPINE WITHOUT CONTRAST  TECHNIQUE: Multidetector CT imaging of the head and cervical spine was performed following the standard protocol without intravenous contrast. Multiplanar CT image reconstructions of the cervical spine were also generated.   COMPARISON:  None.  FINDINGS: CT HEAD FINDINGS  Sinuses/Soft tissues: Right supraorbital soft tissue injury. Normal appearance of the orbits and globes, without retrobulbar hemorrhage. There is also soft tissue swelling about the posterior vertex on image 56/series 5 and possible soft tissue swelling about the high left frontal scalp on image 54/series 4.  No underlying orbital fracture. Hyperostosis frontalis interna. No skull fracture. Clear mastoid air cells. Mucosal thickening of ethmoid air cells.  Intracranial: Expected cerebral atrophy. No mass lesion, hemorrhage, hydrocephalus, acute infarct, intra-axial, or extra-axial fluid collection.  CT CERVICAL SPINE FINDINGS  Spinal visualization through the bottom of T3. Prevertebral soft tissues are within normal limits. No apical pneumothorax.  Nodule in the left parotid gland which measures 1.3 cm on image 42/series 7  Cervical spondylosis with areas of bilateral neural foraminal narrowing, primarily at C5-6.  Skull base intact. Maintenance of vertebral body height and alignment. Facets are well-aligned. Coronal reformats demonstrate a normal C1-C2 articulation.  IMPRESSION: 1. Right supraorbital and scalp soft tissue swelling without acute intracranial abnormality. 2. No acute fracture or subluxation within the cervical spine. 3. A left parotid gland nodule which is indeterminate. Of questionable clinical significance, given patient age.   Electronically Signed   By: Jeronimo Greaves M.D.   On: 01/02/2013 00:25   Ct Cervical Spine Wo Contrast  01/02/2013   CLINICAL DATA:  Fall at home.  Injury to right eye.  EXAM: CT HEAD WITHOUT CONTRAST  CT CERVICAL  SPINE WITHOUT CONTRAST  TECHNIQUE: Multidetector CT imaging of the head and cervical spine was performed following the standard protocol without intravenous contrast. Multiplanar CT image reconstructions of the cervical spine were also generated.  COMPARISON:  None.  FINDINGS: CT HEAD FINDINGS  Sinuses/Soft tissues:  Right supraorbital soft tissue injury. Normal appearance of the orbits and globes, without retrobulbar hemorrhage. There is also soft tissue swelling about the posterior vertex on image 56/series 5 and possible soft tissue swelling about the high left frontal scalp on image 54/series 4.  No underlying orbital fracture. Hyperostosis frontalis interna. No skull fracture. Clear mastoid air cells. Mucosal thickening of ethmoid air cells.  Intracranial: Expected cerebral atrophy. No mass lesion, hemorrhage, hydrocephalus, acute infarct, intra-axial, or extra-axial fluid collection.  CT CERVICAL SPINE FINDINGS  Spinal visualization through the bottom of T3. Prevertebral soft tissues are within normal limits. No apical pneumothorax.  Nodule in the left parotid gland which measures 1.3 cm on image 42/series 7  Cervical spondylosis with areas of bilateral neural foraminal narrowing, primarily at C5-6.  Skull base intact. Maintenance of vertebral body height and alignment. Facets are well-aligned. Coronal reformats demonstrate a normal C1-C2 articulation.  IMPRESSION: 1. Right supraorbital and scalp soft tissue swelling without acute intracranial abnormality. 2. No acute fracture or subluxation within the cervical spine. 3. A left parotid gland nodule which is indeterminate. Of questionable clinical significance, given patient age.   Electronically Signed   By: Jeronimo Greaves M.D.   On: 01/02/2013 00:25   Dg Chest Portable 1 View  01/02/2013   CLINICAL DATA:  Fall with hip injury.  Leukocytosis.  EXAM: PORTABLE CHEST - 1 VIEW  COMPARISON:  None.  FINDINGS: Osteopenia.  Patient rotated to the right. Artifact over the upper chest. Mild cardiomegaly. No pleural effusion or pneumothorax. No lobar consolidation. Lower lobe and peripheral predominant interstitial opacities.  IMPRESSION: Lower lobe and peripheral predominant interstitial opacities, suspicious for interstitial lung disease, likely pulmonary fibrosis.  No acute  superimposed process.   Electronically Signed   By: Jeronimo Greaves M.D.   On: 01/02/2013 01:53    Positive ROS: All other systems have been reviewed and were otherwise negative with the exception of those mentioned in the HPI and as above.  Physical Exam: General: Alert, no acute distress Cardiovascular: No pedal edema Respiratory: No cyanosis, no use of accessory musculature GI: No organomegaly, abdomen is soft and non-tender Skin: No lesions in the area of chief complaint Neurologic: Sensation intact distally Psychiatric: Patient is competent for consent with normal mood and affect Lymphatic: No axillary or cervical lymphadenopathy  MUSCULOSKELETAL:   RLE - held in ER - NVI, WWP   Assessment: 1. Right intertrochanteric hip fracture 2. Right inferior pubic ramus fracture  Plan: - continue NPO - plan for surgery this pm for the hip fracture - plan to treat pubic ramus fracture nonop - call with questions - discussed r/b/a to surgery with patient and patient elects to proceed  Thank you for the consult and the opportunity to see Ms. Kaitlin Brennan. Glee Arvin, MD [facility] [phone] 7:50 AM

## 2013-01-02 NOTE — Care Management Note (Signed)
   CARE MANAGEMENT NOTE 01/02/2013  Patient:  Kaitlin Brennan, Kaitlin Brennan   Account Number:  [id number]  Date Initiated:  01/02/2013  Documentation initiated by:  Johny Shock  Subjective/Objective Assessment:   Consult for [facility], however this is 79yr old female with fx hip, for ORIF this pm and lives alone.     Action/Plan:   01/02/2013 Pt most likely will need rehab post op, will continue to follow.   Anticipated DC Date:  01/09/2013   Anticipated DC Plan:  SKILLED NURSING FACILITY         Choice offered to / List presented to:             Status of service:  In process, will continue to follow Medicare Important Message given?   (If response is "NO", the following Medicare IM given date fields will be blank) Date Medicare IM given:   Date Additional Medicare IM given:    Discharge Disposition:    Per UR Regulation:    If discussed at Long Length of Stay Meetings, dates discussed:    Comments:

## 2013-01-02 NOTE — Transfer of Care (Signed)
Immediate Anesthesia Transfer of Care Note  Patient: Kaitlin Brennan  Procedure(s) Performed: Procedure(s): INTRAMEDULLARY (IM) NAIL INTERTROCHANTRIC RIGHT HIP (Right)  Patient Location: PACU  Anesthesia Type:General  Level of Consciousness: awake, alert , patient cooperative and confused  Airway & Oxygen Therapy: Patient Spontanous Breathing and Patient connected to nasal cannula oxygen  Post-op Assessment: Report given to PACU RN and Post -op Vital signs reviewed and stable  Post vital signs: Reviewed and stable  Complications: No apparent anesthesia complications

## 2013-01-02 NOTE — Progress Notes (Addendum)
[facility] PROGRESS NOTE  Kaitlin Brennan ZOX:[id number] DOB: Jan 14, 1922 DOA: 01/01/2013 PCP: No PCP Per Patient  Assessment/Plan: Close comminuted right intertrochanteric fracture of proximal femur Secondary to mechanical fall. Patient also has a right inferior pubic Tamai fracture orthopedics consulted and planned for surgery today for the proximal femur fracture. Plan to treat pubic rami fracture nonoperatively. -Continue IV fluids and n.p.o. -Pain control  Pubic rami fracture  treat non operatively  Elevated blood pressure On when necessary hydralazine. Has history of hypertension but not on any medications  Diabetes mellitus Not on any medications. Continue sliding scale insulin. Check A1c  Rhabdomyolysis Monitor with IV fluids  Large skin ulcer over lateral left neck Patient reported that she did not want to be treated. Wound consult consulted.  Patient does not have a PCP in the community and will need one upon discharge.   Code Status: DO NOT RESUSCITATE Family Communication: Son at bedside  Disposition Plan: Likely needs skilled nursing facility   Consultants:  Orthopedics ( Dr Roda Shutters)  Procedures:  For surgery today  Antibiotics:  None  HPI/Subjective: Admission H&P reviewed  Objective: Filed Vitals:   01/02/13 0851  BP: 144/53  Pulse: 94  Temp: 99.2 F (37.3 C)  Resp: 18    Intake/Output Summary (Last 24 hours) at 01/02/13 1111 Last data filed at 01/02/13 0820  Gross per 24 hour  Intake 103.33 ml  Output      0 ml  Net 103.33 ml   Filed Weights   01/02/13 0255  Weight: 76 kg (167 lb 8.8 oz)    Exam:   General:  Elderly female lying in bed in no acute distress  HEENT: No pallor, moist oral mucosa, multiple facial lesion with a large ulcer over left side of the neck below the ear with area of  bleeding.  Cardiovascular: NS1&S2, no murmurs rub or gallop  Chest: Clear to auscultation bilaterally, no added sounds  Abdomen:  Soft, nontender, nondistended, bowel sounds present, increased  Extremity: Right hip is shortened and externally rotated, able to move her toes with no difficulty  WJX:[id number] , appears confused,  non focal       Data Reviewed: Basic Metabolic Panel:  Recent Labs Lab 01/01/13 2305 01/02/13 0354  NA 138 140  K 3.9 3.8  CL 104 107  CO2 21 19  GLUCOSE 231* 208*  BUN 13 12  CREATININE 0.65 0.59  CALCIUM 8.8 8.5   Liver Function Tests:  Recent Labs Lab 01/02/13 0354  AST 36  ALT 17  ALKPHOS 86  BILITOT 0.9  PROT 6.6  ALBUMIN 2.9*   No results found for this basename: LIPASE, AMYLASE,  in the last 168 hours No results found for this basename: AMMONIA,  in the last 168 hours CBC:  Recent Labs Lab 01/01/13 2305  WBC 24.9*  NEUTROABS 22.4*  HGB 12.4  HCT 36.5  MCV 84.3  PLT 232   Cardiac Enzymes:  Recent Labs Lab 01/01/13 2305 01/02/13 0354  CKTOTAL 875* 744*  TROPONINI  --  <0.30   BNP (last 3 results) No results found for this basename: PROBNP,  in the last 8760 hours CBG:  Recent Labs Lab 01/02/13 0257 01/02/13 0803  GLUCAP 202* 177*    No results found for this or any previous visit (from the past 240 hour(s)).   Studies: Dg Hip Complete Right  01/02/2013   CLINICAL DATA:  Fall with right hip pain.  EXAM: RIGHT HIP - COMPLETE 2+ VIEW  COMPARISON:  None.  FINDINGS: Moderate osteopenia. Degenerative irregularity of the symphysis pubis.  Comminuted inter trochanteric right femur fracture, with separate lesser trochanteric fracture fragment.  Possible minimally displaced right inferior pubic ramus fracture.  IMPRESSION: Comminuted intertrochanteric right femur fracture.  Possible right inferior pubic ramus minimally displaced fracture.   Electronically Signed   By: Jeronimo Greaves M.D.   On: 01/02/2013 00:27   Ct Head Wo Contrast  01/02/2013   CLINICAL DATA:  Fall at home.  Injury to right eye.  EXAM: CT HEAD WITHOUT CONTRAST  CT CERVICAL SPINE  WITHOUT CONTRAST  TECHNIQUE: Multidetector CT imaging of the head and cervical spine was performed following the standard protocol without intravenous contrast. Multiplanar CT image reconstructions of the cervical spine were also generated.  COMPARISON:  None.  FINDINGS: CT HEAD FINDINGS  Sinuses/Soft tissues: Right supraorbital soft tissue injury. Normal appearance of the orbits and globes, without retrobulbar hemorrhage. There is also soft tissue swelling about the posterior vertex on image 56/series 5 and possible soft tissue swelling about the high left frontal scalp on image 54/series 4.  No underlying orbital fracture. Hyperostosis frontalis interna. No skull fracture. Clear mastoid air cells. Mucosal thickening of ethmoid air cells.  Intracranial: Expected cerebral atrophy. No mass lesion, hemorrhage, hydrocephalus, acute infarct, intra-axial, or extra-axial fluid collection.  CT CERVICAL SPINE FINDINGS  Spinal visualization through the bottom of T3. Prevertebral soft tissues are within normal limits. No apical pneumothorax.  Nodule in the left parotid gland which measures 1.3 cm on image 42/series 7  Cervical spondylosis with areas of bilateral neural foraminal narrowing, primarily at C5-6.  Skull base intact. Maintenance of vertebral body height and alignment. Facets are well-aligned. Coronal reformats demonstrate a normal C1-C2 articulation.  IMPRESSION: 1. Right supraorbital and scalp soft tissue swelling without acute intracranial abnormality. 2. No acute fracture or subluxation within the cervical spine. 3. A left parotid gland nodule which is indeterminate. Of questionable clinical significance, given patient age.   Electronically Signed   By: Jeronimo Greaves M.D.   On: 01/02/2013 00:25   Ct Cervical Spine Wo Contrast  01/02/2013   CLINICAL DATA:  Fall at home.  Injury to right eye.  EXAM: CT HEAD WITHOUT CONTRAST  CT CERVICAL SPINE WITHOUT CONTRAST  TECHNIQUE: Multidetector CT imaging of the head and  cervical spine was performed following the standard protocol without intravenous contrast. Multiplanar CT image reconstructions of the cervical spine were also generated.  COMPARISON:  None.  FINDINGS: CT HEAD FINDINGS  Sinuses/Soft tissues: Right supraorbital soft tissue injury. Normal appearance of the orbits and globes, without retrobulbar hemorrhage. There is also soft tissue swelling about the posterior vertex on image 56/series 5 and possible soft tissue swelling about the high left frontal scalp on image 54/series 4.  No underlying orbital fracture. Hyperostosis frontalis interna. No skull fracture. Clear mastoid air cells. Mucosal thickening of ethmoid air cells.  Intracranial: Expected cerebral atrophy. No mass lesion, hemorrhage, hydrocephalus, acute infarct, intra-axial, or extra-axial fluid collection.  CT CERVICAL SPINE FINDINGS  Spinal visualization through the bottom of T3. Prevertebral soft tissues are within normal limits. No apical pneumothorax.  Nodule in the left parotid gland which measures 1.3 cm on image 42/series 7  Cervical spondylosis with areas of bilateral neural foraminal narrowing, primarily at C5-6.  Skull base intact. Maintenance of vertebral body height and alignment. Facets are well-aligned. Coronal reformats demonstrate a normal C1-C2 articulation.  IMPRESSION: 1. Right supraorbital and scalp soft tissue swelling without acute intracranial abnormality. 2. No  acute fracture or subluxation within the cervical spine. 3. A left parotid gland nodule which is indeterminate. Of questionable clinical significance, given patient age.   Electronically Signed   By: Jeronimo Greaves M.D.   On: 01/02/2013 00:25   Dg Chest Portable 1 View  01/02/2013   CLINICAL DATA:  Fall with hip injury.  Leukocytosis.  EXAM: PORTABLE CHEST - 1 VIEW  COMPARISON:  None.  FINDINGS: Osteopenia.  Patient rotated to the right. Artifact over the upper chest. Mild cardiomegaly. No pleural effusion or pneumothorax. No  lobar consolidation. Lower lobe and peripheral predominant interstitial opacities.  IMPRESSION: Lower lobe and peripheral predominant interstitial opacities, suspicious for interstitial lung disease, likely pulmonary fibrosis.  No acute superimposed process.   Electronically Signed   By: Jeronimo Greaves M.D.   On: 01/02/2013 01:53   Dg Femur Right Port  01/02/2013   CLINICAL DATA:  Lateral view of the femoral shaft  EXAM: PORTABLE RIGHT FEMUR - 2 VIEW.  The patient refused the AP view  COMPARISON:  None.  FINDINGS: There is a fracture of the intertrochanteric -sub trochanteric region of the right femur. Avulsion of the lesser trochanter is demonstrated. The femoral head and neck cannot be adequately assessed on this single film. The femoral diaphysis appears intact where visualized.  IMPRESSION: This is a very limited single view exam. At the superior margin of the study partially imaged is a comminuted angulated intertrochanteric-sub trochanteric fracture of the proximal right femur with avulsion of the lesser trochanter.   Electronically Signed   By: David  [place]   On: 01/02/2013 08:07   Dg Knee Right Port  01/02/2013   CLINICAL DATA:  Recent fall with hip fracture  EXAM: PORTABLE RIGHT KNEE - 1-2 VIEW  COMPARISON:  None.  FINDINGS: There is degenerative joint disease of the right knee primarily involving the medial compartment where there is loss of joint space and sclerosis. No acute fracture is seen. No joint effusion is noted.  IMPRESSION: Degenerative change primarily involving the medial compartment. No acute fracture.   Electronically Signed   By: Dwyane Dee M.D.   On: 01/02/2013 08:16    Scheduled Meds: . insulin aspart  0-9 Units Subcutaneous TID WC  . [START ON 01/03/2013] pneumococcal 23 valent vaccine  0.5 mL Intramuscular Tomorrow-1000   Continuous Infusions: . sodium chloride 100 mL/hr at 01/02/13 0358      Time spent: 25 minutes    Shontia Gillooly  [facility] Pager  [phone] If 7PM-7AM, please contact night-coverage at [email or web address], password [facility] 01/02/2013, 11:11 AM  LOS: 1 day

## 2013-01-02 NOTE — H&P (Signed)
H&P update  The surgical history has been reviewed and remains accurate without interval change.  The patient was re-examined and patient's physiologic condition has not changed significantly in the last 30 days. The condition still exists that makes this procedure necessary. The treatment plan remains the same, without new options for care.  No new pharmacological allergies or types of therapy has been initiated that would change the plan or the appropriateness of the plan.  The patient and/or family understand the potential benefits and risks.  Mayra Reel, MD 01/02/2013 11:10 AM

## 2013-01-02 NOTE — Progress Notes (Signed)
Chaplain provided ministry of presence, emotional and spiritual support to the patient and her son. Chaplain will follow up as needed or requested.   01/02/13 1300  Clinical Encounter Type  Visited With Patient and family together  Visit Type Initial;Spiritual support;Social support

## 2013-01-02 NOTE — Consult Note (Addendum)
WOC wound consult note Reason for Consult: Consult requested for left neck wound.  Pt has stated she does not want treatment for this site.  Son at bedside to assist with answering questions.  Pt states she has a skin condition for many years and has been checked out by a dermatologist for left neck lesion, but son states this was 2 years ago.  She has several lesions to her face which are atypical in appearance, including cheeks, forehead, upper eyebrow area on the right , and bridge of nose.  All sites are dry, scabbed, raised above skin level, and irregular in shape. Pt's arm are dry and scaly.  She states she uses a topical cream at home  For her facial lesions which is prescription but is unable to provide the name. She uses a dry dressing to left neck at home and denies need to return to the dermatologist.   Wound type: Left neck with full thickness lesion; moist, dark red, bleeding small amt, raised above skin level, and irregular in shape.  Measurement:3X3cm Dressing procedure/placement/frequency: Silicone foam non-adherent dressing to protect site from further injury when moving or the oxygen tube is rubbing over site.  Recommend follow-up with outpatient dermatologist after discharge; there is no inpatient dermatology service at [facility].  Discussed plan of care with son at bedside, who states it is difficult to convince his mother to go to the doctor. Please re-consult if further assistance is needed.  Thank-you,  Cammie Mcgee MSN, RN, CWOCN, [place], CNS [phone]

## 2013-01-02 NOTE — Progress Notes (Signed)
INITIAL NUTRITION ASSESSMENT  DOCUMENTATION CODES Per approved criteria  -Obesity Unspecified   INTERVENTION: Recommend MVI and Glucerna Shake daily once diet order permits. RD to continue to follow nutrition care plan.  NUTRITION DIAGNOSIS: Increased nutrient needs related to wound healing and post-op healing as evidenced by estimated needs.   Goal: Intake to meet >90% of estimated nutrition needs.  Monitor:  weight trends, lab trends, I/O's, PO intake, supplement tolerance  Reason for Assessment: MD Consult - Hip Fracture Protocol + Low Braden  79 y.o. female  Admitting Dx: Closed comminuted intertrochanteric fracture of proximal femur  ASSESSMENT: PMHx significant for DM and HTN. Admitted s/p fall, duration of fall is unknown. Work-up reveals R hip fracture and R inferior pubic ramus fracture. Pt with large skin ulcer to L side of neck. Plan for pt to go to surgery this morning.  RD spoke with pt's family about her oral intake. Per son, pt was eating well PTA. Pt has been in this area for about 2 years and refused to get a PCP while here. Pt with abrasions and skin tears on neck and head. Son's wife is a Data processing manager, not present. Pt has had relatively stable weight, was doing her own grocery shopping and living independently PTA, approximately 2 blocks away from son and daughter-in-law. Pt doesn't follow any diet restrictions and per son "eats a lot of candy." Pt with very poor dentition.  Pt is at nutrition risk given wounds and upcoming surgery.  Blood sugars elevated, ranging from 177 - 208.  Height: Ht Readings from Last 1 Encounters:  01/02/13 5' (1.524 m)    Weight: Wt Readings from Last 1 Encounters:  01/02/13 167 lb 8.8 oz (76 kg)    Ideal Body Weight: 100 lb  % Ideal Body Weight: 167%  Wt Readings from Last 10 Encounters:  01/02/13 167 lb 8.8 oz (76 kg)  01/02/13 167 lb 8.8 oz (76 kg)    Usual Body Weight: n/a  % Usual Body Weight: n/a  BMI:  Body  mass index is 32.72 kg/(m^2). Obese Class I  Estimated Nutritional Needs: Kcal: 1600 - 1750 Protein: 70 - 80 g Fluid: 1.6 - 1.8 liters  Skin:  Neck wound with nodule in the middle L head abrasion  Diet Order: NPO  EDUCATION NEEDS: -No education needs identified at this time   Intake/Output Summary (Last 24 hours) at 01/02/13 0929 Last data filed at 01/02/13 0820  Gross per 24 hour  Intake 103.33 ml  Output      0 ml  Net 103.33 ml    Last BM: PTA  Labs:   Recent Labs Lab 01/01/13 2305 01/02/13 0354  NA 138 140  K 3.9 3.8  CL 104 107  CO2 21 19  BUN 13 12  CREATININE 0.65 0.59  CALCIUM 8.8 8.5  GLUCOSE 231* 208*    CBG (last 3)   Recent Labs  01/02/13 0257 01/02/13 0803  GLUCAP 202* 177*   No results found for this basename: HGBA1C     Scheduled Meds: . insulin aspart  0-9 Units Subcutaneous TID WC  . [START ON 01/03/2013] pneumococcal 23 valent vaccine  0.5 mL Intramuscular Tomorrow-1000    Continuous Infusions: . sodium chloride 100 mL/hr at 01/02/13 0358    Past Medical History  Diagnosis Date  . Diabetes mellitus without complication   . Hypertension     History reviewed. No pertinent past surgical history.  Jarold Motto MS, RD, LDN Pager: [phone] After-hours pager: [phone]

## 2013-01-02 NOTE — Preoperative (Signed)
Beta Blockers   Reason not to administer Beta Blockers:Not Applicable 

## 2013-01-02 NOTE — Anesthesia Procedure Notes (Signed)
Procedure Name: Intubation Date/Time: 01/02/2013 3:19 PM Performed by: Angelica Pou Pre-anesthesia Checklist: Patient identified, Patient being monitored, Emergency Drugs available, Timeout performed and Suction available Patient Re-evaluated:Patient Re-evaluated prior to inductionOxygen Delivery Method: Circle system utilized Preoxygenation: Pre-oxygenation with 100% oxygen Intubation Type: IV induction Ventilation: Mask ventilation without difficulty Laryngoscope Size: Mac and 3 Grade View: Grade I Tube type: Oral Tube size: 7.0 mm Number of attempts: 1 Airway Equipment and Method: Stylet Placement Confirmation: ETT inserted through vocal cords under direct vision,  breath sounds checked- equal and bilateral and positive ETCO2 Secured at: 22 cm Tube secured with: Tape Dental Injury: Teeth and Oropharynx as per pre-operative assessment  Comments: Smooth IV induction by Dr Jean Rosenthal; easy atraumatic intubation by Pasty Arch. Copious thick yellow secretions present in hypopharynx; grade 1 view once cleared with suction.

## 2013-01-02 NOTE — Op Note (Signed)
   Date of Surgery: 01/02/2013  INDICATIONS: Ms. Wareing is a 79 y.o.-year-old female who was involved in a mechanical fall and sustained a left hip fracture (pertrochanteric-type). The risks and benefits of the procedure discussed with the family prior to the procedure and all questions were answered; consent was obtained.  PREOPERATIVE DIAGNOSIS: right hip fracture (intertrochanteric-type)   POSTOPERATIVE DIAGNOSIS: Same   PROCEDURE: Treatment of intertrochanteric fracture with intramedullary implant. CPT [phone]   SURGEON: N. Glee Arvin, M.D.   ANESTHESIA: general   IV FLUIDS AND URINE: See anesthesia record   ESTIMATED BLOOD LOSS: 150 cc  IMPLANTS: [employer] IMHS 12 x 36   DRAINS: None.   COMPLICATIONS: None.   DESCRIPTION OF PROCEDURE: The patient was brought to the operating room and placed supine on the operating table. The patient's leg had been signed prior to the procedure. The patient had the anesthesia placed by the anesthesiologist. The prep verification and incision time-outs were performed to confirm that this was the correct patient, site, side and location. The patient had an SCD on the opposite lower extremity. The patient did receive antibiotics prior to the incision and was re-dosed during the procedure as needed at indicated intervals. The patient was positioned on the fracture table with the table in traction and internal rotation to reduce the hip. The well leg was placed in a hemi-lithotomy position and all bony prominences were well-padded. The patient had the lower extremity prepped and draped in the standard surgical fashion. The incision was made 4 finger breadths superior to the greater trochanter. A guide pin was inserted into the tip of the greater trochanter under fluoroscopic guidance. An opening reamer was used to gain access to the femoral canal. The nail length was measured and inserted down the femoral canal to its proper depth. The appropriate  version of insertion for the lag screw was found under fluoroscopy. The lag screw was inserted as near to center-center in the head as possible. The leg was taken out of traction, then the compression screw was then placed to compress across the fracture using the lag screw. The wound was copiously irrigated with saline and the subcutaneous layer closed with 2.0 vicryl and the skin was reapproximated with staples. The wounds were cleaned and dried a final time and a sterile dressing was placed. The hip was taken through a range of motion at the end of the case under fluoroscopic imaging to visualize the approach-withdraw phenomenon and confirm implant length in the head. The patient was then awakened from anesthesia and taken to the recovery room in stable condition. All counts were correct at the end of the case.   POSTOPERATIVE PLAN: The patient will be weight bearing as tolerated and will return in 2 weeks for staple removal and the patient will receive DVT prophylaxis based on other medications, activity level, and risk ratio of bleeding to thrombosis.  Mayra Reel, MD [facility] [phone] 4:46 PM

## 2013-01-02 NOTE — Anesthesia Postprocedure Evaluation (Signed)
  Anesthesia Post-op Note  Patient: Kaitlin Brennan  Procedure(s) Performed: Procedure(s): INTRAMEDULLARY (IM) NAIL INTERTROCHANTRIC RIGHT HIP (Right)  Patient Location: PACU  Anesthesia Type:General  Level of Consciousness: awake, alert , oriented and patient cooperative  Airway and Oxygen Therapy: Patient Spontanous Breathing and Patient connected to nasal cannula oxygen  Post-op Pain: mild  Post-op Assessment: Post-op Vital signs reviewed, Patient's Cardiovascular Status Stable, Respiratory Function Stable, Patent Airway, No signs of Nausea or vomiting and Pain level controlled  Post-op Vital Signs: Reviewed and stable  Complications: No apparent anesthesia complications

## 2013-01-02 NOTE — Anesthesia Preprocedure Evaluation (Addendum)
Anesthesia Evaluation  Patient identified by MRN, date of birth, ID band Patient awake    Reviewed: Allergy & Precautions, H&P , NPO status , Patient's Chart, lab work & pertinent test results  History of Anesthesia Complications Negative for: history of anesthetic complications  Airway Mallampati: II TM Distance: >3 FB Neck ROM: Full    Dental  (+) Dental Advisory Given and Chipped   Pulmonary neg pulmonary ROS, former smoker (quit in 50's),  breath sounds clear to auscultation  Pulmonary exam normal       Cardiovascular hypertension (no longer on meds), Rhythm:Regular Rate:Normal     Neuro/Psych negative neurological ROS     GI/Hepatic negative GI ROS, Neg liver ROS,   Endo/Other  diabetes (glu 146, diet controlled)  Renal/GU negative Renal ROS     Musculoskeletal   Abdominal (+) + obese,   Peds  Hematology   Anesthesia Other Findings   Reproductive/Obstetrics                          Anesthesia Physical Anesthesia Plan  ASA: III  Anesthesia Plan: General   Post-op Pain Management:    Induction: Intravenous  Airway Management Planned: Oral ETT  Additional Equipment:   Intra-op Plan:   Post-operative Plan: Extubation in OR  Informed Consent: I have reviewed the patients History and Physical, chart, labs and discussed the procedure including the risks, benefits and alternatives for the proposed anesthesia with the patient or authorized representative who has indicated his/her understanding and acceptance.   Dental advisory given  Plan Discussed with: CRNA and Surgeon  Anesthesia Plan Comments: (Plan routine monitors, GETA)        Anesthesia Quick Evaluation

## 2013-01-02 NOTE — H&P (Addendum)
[facility] History and Physical  Florentina Marquart GEX:[id number] DOB: [birth year] DOA: 01/01/2013  Referring physician: ER physician. PCP: No PCP Per Patient   Chief Complaint: Fall and found on the floor.  Most of the history provided by patient's son.  HPI: Kaitlin Brennan is a 79 y.o. female with previous history of diabetes and hypertension presently not on any medications for last few years was found on the floor by patient's son as patient was not responding to phone calls. Patient was last seen 3 days ago but patient's son. Patient's son had brought her some food which she had eaten. The duration of the fall is not known but patient was not unconscious. X-rays reveal right hip fracture and right inferior pubic rami fracture and on call orthopedic surgeon Dr. Roda Shutters has been consulted. Patient will be admitted for further management. Presently patient denies any chest pain or shortness of breath nausea vomiting abdominal pain diarrhea. Labs revealed leukocytosis but patient is afebrile. Also has mildly elevated CK levels.   Review of Systems: As presented in the history of presenting illness, rest negative.  Past Medical History  Diagnosis Date  . Diabetes mellitus without complication   . Hypertension    History reviewed. No pertinent past surgical history. Social History:  reports that she has never smoked. She does not have any smokeless tobacco history on file. She reports that she does not drink alcohol or use illicit drugs. Where does patient live home. Can patient participate in ADLs? Not sure.  No Known Allergies  Family History:  Family History  Problem Relation Age of Onset  . Lung cancer Father       Prior to Admission medications   Not on File    Physical Exam: Filed Vitals:   01/01/13 2234 01/02/13 0100 01/02/13 0145  BP: 158/58 167/61 160/65  Pulse: 86 85 91  Temp: 97.5 F (36.4 C)    TempSrc: Oral    Resp: 18 15 29   SpO2: 98% 100% 100%      General:  Well-developed and nourished.  Eyes: Anicteric no pallor.  ENT: No discharge from ears his nose mouth  Neck: There is a large ulcer on the left side of the neck which patient does not want treated.  Cardiovascular: S1-S2 heard.  Respiratory: No rhonchi or crepitations.  Abdomen: Soft nontender bowel sounds present.  Skin: Large ulcer on the left side of the neck which patient's family saying it is chronic and patient does not want to be treated.  Musculoskeletal: Pain on moving her right hip.  Psychiatric: Presently alert awake and oriented to her name and place.  Neurologic: Follows commands and moves all extremities.  Labs on Admission:  Basic Metabolic Panel:  Recent Labs Lab 01/01/13 2305  NA 138  K 3.9  CL 104  CO2 21  GLUCOSE 231*  BUN 13  CREATININE 0.65  CALCIUM 8.8   Liver Function Tests: No results found for this basename: AST, ALT, ALKPHOS, BILITOT, PROT, ALBUMIN,  in the last 168 hours No results found for this basename: LIPASE, AMYLASE,  in the last 168 hours No results found for this basename: AMMONIA,  in the last 168 hours CBC:  Recent Labs Lab 01/01/13 2305  WBC 24.9*  NEUTROABS 22.4*  HGB 12.4  HCT 36.5  MCV 84.3  PLT 232   Cardiac Enzymes:  Recent Labs Lab 01/01/13 2305  CKTOTAL 875*    BNP (last 3 results) No results found for this basename: PROBNP,  in the  last 8760 hours CBG: No results found for this basename: GLUCAP,  in the last 168 hours  Radiological Exams on Admission: Dg Hip Complete Right  01/02/2013   CLINICAL DATA:  Fall with right hip pain.  EXAM: RIGHT HIP - COMPLETE 2+ VIEW  COMPARISON:  None.  FINDINGS: Moderate osteopenia. Degenerative irregularity of the symphysis pubis.  Comminuted inter trochanteric right femur fracture, with separate lesser trochanteric fracture fragment.  Possible minimally displaced right inferior pubic ramus fracture.  IMPRESSION: Comminuted intertrochanteric right femur  fracture.  Possible right inferior pubic ramus minimally displaced fracture.   Electronically Signed   By: Jeronimo Greaves M.D.   On: 01/02/2013 00:27   Ct Head Wo Contrast  01/02/2013   CLINICAL DATA:  Fall at home.  Injury to right eye.  EXAM: CT HEAD WITHOUT CONTRAST  CT CERVICAL SPINE WITHOUT CONTRAST  TECHNIQUE: Multidetector CT imaging of the head and cervical spine was performed following the standard protocol without intravenous contrast. Multiplanar CT image reconstructions of the cervical spine were also generated.  COMPARISON:  None.  FINDINGS: CT HEAD FINDINGS  Sinuses/Soft tissues: Right supraorbital soft tissue injury. Normal appearance of the orbits and globes, without retrobulbar hemorrhage. There is also soft tissue swelling about the posterior vertex on image 56/series 5 and possible soft tissue swelling about the high left frontal scalp on image 54/series 4.  No underlying orbital fracture. Hyperostosis frontalis interna. No skull fracture. Clear mastoid air cells. Mucosal thickening of ethmoid air cells.  Intracranial: Expected cerebral atrophy. No mass lesion, hemorrhage, hydrocephalus, acute infarct, intra-axial, or extra-axial fluid collection.  CT CERVICAL SPINE FINDINGS  Spinal visualization through the bottom of T3. Prevertebral soft tissues are within normal limits. No apical pneumothorax.  Nodule in the left parotid gland which measures 1.3 cm on image 42/series 7  Cervical spondylosis with areas of bilateral neural foraminal narrowing, primarily at C5-6.  Skull base intact. Maintenance of vertebral body height and alignment. Facets are well-aligned. Coronal reformats demonstrate a normal C1-C2 articulation.  IMPRESSION: 1. Right supraorbital and scalp soft tissue swelling without acute intracranial abnormality. 2. No acute fracture or subluxation within the cervical spine. 3. A left parotid gland nodule which is indeterminate. Of questionable clinical significance, given patient age.    Electronically Signed   By: Jeronimo Greaves M.D.   On: 01/02/2013 00:25   Ct Cervical Spine Wo Contrast  01/02/2013   CLINICAL DATA:  Fall at home.  Injury to right eye.  EXAM: CT HEAD WITHOUT CONTRAST  CT CERVICAL SPINE WITHOUT CONTRAST  TECHNIQUE: Multidetector CT imaging of the head and cervical spine was performed following the standard protocol without intravenous contrast. Multiplanar CT image reconstructions of the cervical spine were also generated.  COMPARISON:  None.  FINDINGS: CT HEAD FINDINGS  Sinuses/Soft tissues: Right supraorbital soft tissue injury. Normal appearance of the orbits and globes, without retrobulbar hemorrhage. There is also soft tissue swelling about the posterior vertex on image 56/series 5 and possible soft tissue swelling about the high left frontal scalp on image 54/series 4.  No underlying orbital fracture. Hyperostosis frontalis interna. No skull fracture. Clear mastoid air cells. Mucosal thickening of ethmoid air cells.  Intracranial: Expected cerebral atrophy. No mass lesion, hemorrhage, hydrocephalus, acute infarct, intra-axial, or extra-axial fluid collection.  CT CERVICAL SPINE FINDINGS  Spinal visualization through the bottom of T3. Prevertebral soft tissues are within normal limits. No apical pneumothorax.  Nodule in the left parotid gland which measures 1.3 cm on image 42/series 7  Cervical spondylosis with areas of bilateral neural foraminal narrowing, primarily at C5-6.  Skull base intact. Maintenance of vertebral body height and alignment. Facets are well-aligned. Coronal reformats demonstrate a normal C1-C2 articulation.  IMPRESSION: 1. Right supraorbital and scalp soft tissue swelling without acute intracranial abnormality. 2. No acute fracture or subluxation within the cervical spine. 3. A left parotid gland nodule which is indeterminate. Of questionable clinical significance, given patient age.   Electronically Signed   By: Jeronimo Greaves M.D.   On: 01/02/2013 00:25    Dg Chest Portable 1 View  01/02/2013   CLINICAL DATA:  Fall with hip injury.  Leukocytosis.  EXAM: PORTABLE CHEST - 1 VIEW  COMPARISON:  None.  FINDINGS: Osteopenia.  Patient rotated to the right. Artifact over the upper chest. Mild cardiomegaly. No pleural effusion or pneumothorax. No lobar consolidation. Lower lobe and peripheral predominant interstitial opacities.  IMPRESSION: Lower lobe and peripheral predominant interstitial opacities, suspicious for interstitial lung disease, likely pulmonary fibrosis.  No acute superimposed process.   Electronically Signed   By: Jeronimo Greaves M.D.   On: 01/02/2013 01:53    EKG: Independently reviewed. Normal sinus rhythm with nonspecific ST changes.  Assessment/Plan Principal Problem:   Closed comminuted intertrochanteric fracture of proximal femur Active Problems:   Hyperglycemia   Elevated blood pressure   Leucocytosis   Femur fracture   1. Close comminuted right intertrochanteric fracture of the proximal femur and right inferior pubic rami fracture status post fall - orthopedic surgeon has been consulted. From medical standpoint patient looked stable for surgery. Further recommendations orthopedic surgery. 2. Hyperglycemia - patient has had previous history of diabetes and has been off medications for last few years after patient's diabetic control improved with weight loss. Check hemoglobin A1c and presently I have placed patient on sliding-scale coverage. 3. Elevated blood pressure - presently I have placed patient on when necessary IV hydralazine for systolic blood pressure more than 160. 4. Leukocytosis - patient is afebrile. Chest x-ray and UA are unremarkable follow cultures and CBC. 5. Mild rhabdomyolysis - hydrate and closely follow CK levels. 6. Large skin ulcer on the left side of the neck - patient does not wanted to be treated. We'll have wound consult. Patient also has multiple facial lesions which patient does not want to be  treated.    Code Status: DO NOT RESUSCITATE as confirmed the patient's son  Family Communication: Patient's son at the bedside.  Disposition Plan: Admit to inpatient.    Mandolin Falwell N. [facility] Pager [phone].  If 7PM-7AM, please contact night-coverage [email or web address] Password [email or web address] 01/02/2013, 2:49 AM

## 2013-01-02 NOTE — ED Provider Notes (Signed)
79 year old female apparently lost her balance and fell at home. She could not get up from the floor. Her family found her on the floor and thinks she may been down for several hours. She is not I. she complaining of anything but that was not able to get her up. On exam, she holds her right leg externally rotated at the hip and there is pain with any movement. She also is noted to have a skin avulsion over a mass of the base of the skull on the left. This appears to be a skin cancer. There's also an abrasion in the right side of the occiput. She does not have a PCP. X-ray shows intratrochanteric fracture the right hip with images viewed by me. She will need to be admitted for operative management.  Medical screening examination/treatment/procedure(s) were conducted as a shared visit with non-physician practitioner(s) and myself.  I personally evaluated the patient during the encounter.  EKG Interpretation    Date/Time:  Thursday January 02 2013 00:16:27 EST Ventricular Rate:  87 PR Interval:  175 QRS Duration: 119 QT Interval:  377 QTC Calculation: 453 R Axis:   -35 Text Interpretation:  Sinus rhythm Atrial premature complex LVH with IVCD and secondary repol abnrm Left axis deviation No old tracing to compare Confirmed by [facility]  MD, Deyci Gesell ([id number]) on 01/02/2013 12:21:23 AM              Dione Booze, MD 01/02/13 [phone]

## 2013-01-03 ENCOUNTER — Encounter (HOSPITAL_COMMUNITY): Payer: Self-pay | Admitting: Orthopaedic Surgery

## 2013-01-03 DIAGNOSIS — E119 Type 2 diabetes mellitus without complications: Secondary | ICD-10-CM | POA: Diagnosis present

## 2013-01-03 DIAGNOSIS — IMO0002 Reserved for concepts with insufficient information to code with codable children: Secondary | ICD-10-CM

## 2013-01-03 LAB — CBC
HCT: 26.6 % — ABNORMAL LOW (ref 36.0–46.0)
Hemoglobin: 9 g/dL — ABNORMAL LOW (ref 12.0–15.0)
MCV: 85.3 fL (ref 78.0–100.0)
RBC: 3.12 MIL/uL — ABNORMAL LOW (ref 3.87–5.11)
RDW: 15.1 % (ref 11.5–15.5)
WBC: 12.4 10*3/uL — ABNORMAL HIGH (ref 4.0–10.5)

## 2013-01-03 LAB — BASIC METABOLIC PANEL
CO2: 23 mEq/L (ref 19–32)
Calcium: 7.7 mg/dL — ABNORMAL LOW (ref 8.4–10.5)
Chloride: 111 mEq/L (ref 96–112)
GFR calc non Af Amer: 75 mL/min — ABNORMAL LOW (ref >90)
Glucose, Bld: 177 mg/dL — ABNORMAL HIGH (ref 70–99)
Potassium: 3.6 mEq/L (ref 3.5–5.1)
Sodium: 141 mEq/L (ref 135–145)

## 2013-01-03 LAB — GLUCOSE, CAPILLARY
Glucose-Capillary: 216 mg/dL — ABNORMAL HIGH (ref 70–99)
Glucose-Capillary: 179 mg/dL — ABNORMAL HIGH (ref 70–99)
Glucose-Capillary: 174 mg/dL — ABNORMAL HIGH (ref 70–99)

## 2013-01-03 LAB — URINE CULTURE: Culture: NO GROWTH

## 2013-01-03 LAB — CK: Total CK: 340 U/L — ABNORMAL HIGH (ref 7–177)

## 2013-01-03 NOTE — Progress Notes (Signed)
Inpatient Diabetes Program Recommendations  AACE/ADA: New Consensus Statement on Inpatient Glycemic Control (2013)  Target Ranges:  Prepandial:   less than 140 mg/dL      Peak postprandial:   less than 180 mg/dL (1-2 hours)      Critically ill patients:  140 - 180 mg/dL   Pt's in-hospital active problem list documents "Hyperglycemia". Pt has HgbA1C of 7.5 %  Diagnosis of diabetes is definitive at 6.5% or greater. No home meds listed for pt for DM.  Added Carb modified to diet orders as well.  Thank you, Lenor Coffin, RN, CNS, Diabetes Coordinator [phone])

## 2013-01-03 NOTE — Evaluation (Signed)
Physical Therapy Evaluation Patient Details Name: Kaitlin Brennan MRN: [id number] DOB: [birth year] Today's Date: 01/03/2013 Time: 4098-1191 PT Time Calculation (min): 28 min  PT Assessment / Plan / Recommendation History of Present Illness  Pt is an 79 y/o female previously independent and living alone in her apt, presenting with right femoral fracture and right inferior pubic ramus fracture after a fall. Pt is now s/p R IM nailing of femur, and is WBAT.   Clinical Impression  This patient presents with acute pain and decreased functional independence following the above mentioned procedure. At the time of PT eval, pt required +2 assist for all transfers and functional mobility. She appeared fearful of pain, however did not complain of pain much throughout session. Pt's son present and reports that he is not able to provide the amount of assistance that she currently needs, and pt and son are both agreeable to SNF at this time.      PT Assessment  Patient needs continued PT services    Follow Up Recommendations  SNF;Supervision/Assistance - 24 hour    Does the patient have the potential to tolerate intense rehabilitation      Barriers to Discharge Decreased caregiver support      Equipment Recommendations  Other (comment) (Equipment needs to be addressed at next venue of care.)    Recommendations for Other Services     Frequency Min 3X/week    Precautions / Restrictions Precautions Precautions: Fall Restrictions Weight Bearing Restrictions: Yes RLE Weight Bearing: Weight bearing as tolerated   Pertinent Vitals/Pain Pt reports she has not had any pain at the beginning of the session. At the end of session, pt states her pain is "not bad".      Mobility  Bed Mobility Bed Mobility: Supine to Sit;Sitting - Scoot to Edge of Bed Supine to Sit: 1: +2 Total assist Supine to Sit: Patient Percentage: 20% Sitting - Scoot to Edge of Bed: 1: +2 Total assist Sitting - Scoot to Edge  of Bed: Patient Percentage: 30% Details for Bed Mobility Assistance: VC's for sequencing and technique. Hand over hand assist to reach for bed rails. Bed pad used for a majority of the transition to EOB. Transfers Transfers: Sit to Stand;Stand to Sit;Stand Pivot Transfers Sit to Stand: 1: +2 Total assist;From bed;With upper extremity assist Sit to Stand: Patient Percentage: 30% Stand to Sit: 1: +2 Total assist;To chair/3-in-1;With upper extremity assist Stand to Sit: Patient Percentage: 40% Stand Pivot Transfers: 1: +2 Total assist Stand Pivot Transfers: Patient Percentage: 40% Details for Transfer Assistance: Step-by-step VC's in L ear for transfer sequencing and safety awareness with the RW. Pt requires increased time for transfer to recliner. Ambulation/Gait Ambulation/Gait Assistance: Not tested (comment)    Exercises     PT Diagnosis: Difficulty walking;Acute pain  PT Problem List: Decreased strength;Decreased range of motion;Decreased activity tolerance;Decreased balance;Decreased mobility;Decreased knowledge of use of DME;Decreased safety awareness;Pain PT Treatment Interventions: DME instruction;Gait training;Stair training;Functional mobility training;Therapeutic activities;Therapeutic exercise;Neuromuscular re-education;Patient/family education     PT Goals(Current goals can be found in the care plan section) Acute Rehab PT Goals Patient Stated Goal: To return to PLOF PT Goal Formulation: With patient/family Time For Goal Achievement: 01/10/13 Potential to Achieve Goals: Good  Visit Information  Last PT Received On: 01/03/13 Assistance Needed: +2 PT/OT/SLP Co-Evaluation/Treatment: Yes PT goals addressed during session: Mobility/safety with mobility;Balance;Proper use of DME;Strengthening/ROM History of Present Illness: Pt is an 79 y/o female previously independent and living alone in her apt, presenting with right femoral fracture and  right inferior pubic ramus fracture  after a fall. Pt is now s/p R IM nailing of femur, and is WBAT.        Prior Functioning  Home Living Family/patient expects to be discharged to:: Private residence Living Arrangements: Alone Available Help at Discharge: Family;Available PRN/intermittently Type of Home: Apartment Home Layout: One level Home Equipment: Cane - single point Prior Function Level of Independence: Independent with assistive device(s) Communication Communication: HOH (Hears out of her L ear, and very little out of her R ear) Dominant Hand: Right    Cognition  Cognition Arousal/Alertness: Awake/alert Behavior During Therapy: WFL for tasks assessed/performed Overall Cognitive Status: Within Functional Limits for tasks assessed    Extremity/Trunk Assessment Upper Extremity Assessment Upper Extremity Assessment: Defer to OT evaluation Lower Extremity Assessment Lower Extremity Assessment: Generalized weakness;RLE deficits/detail RLE Deficits / Details: Decreased strength and AROM consistent with femoral IM nailing RLE: Unable to fully assess due to pain Cervical / Trunk Assessment Cervical / Trunk Assessment: Kyphotic   Balance Balance Balance Assessed: Yes Static Sitting Balance Static Sitting - Balance Support: Feet supported;Bilateral upper extremity supported Static Sitting - Level of Assistance: 4: Min assist Static Sitting - Comment/# of Minutes: 5 minutes while equipment was positioned.  Static Standing Balance Static Standing - Balance Support: Bilateral upper extremity supported Static Standing - Level of Assistance: 4: Min assist  End of Session PT - End of Session Equipment Utilized During Treatment: Gait belt;Oxygen Activity Tolerance: Patient limited by pain;Patient limited by fatigue Patient left: in chair;with call bell/phone within reach;with family/visitor present Nurse Communication: Mobility status  GP     Ruthann Cancer 01/03/2013, 10:28 AM  Ruthann Cancer, PT,  DPT [phone]

## 2013-01-03 NOTE — Progress Notes (Signed)
Orthopedic Tech Progress Note Patient Details:  Kaitlin Brennan [birth year] [id number]  Patient ID: Kaitlin Brennan, female   DOB: 01-22-1922, 79 y.o.   MRN: [id number]   [name] 01/03/2013, 9:09 AMTrapeze bar

## 2013-01-03 NOTE — Progress Notes (Signed)
Pt adamantly refused to move from chair to bed but staff was able to transfer pt from chair to bed with use of a lift. Pt currently resting in bed. Has not voided spontaneously since foley was dc`d this am. Bladder scan showed urine. Encouraged to drink more fluids.

## 2013-01-03 NOTE — Progress Notes (Signed)
Pt adamantly refuses to move from chair to bed. Multiple attempts were made throughout entire day. Pt crying and upset, demanding to "stay right here." Pt educated about pressure ulcers and consequences of staying in one place for too long. Pt continues to refuse to move from chair at this time.

## 2013-01-03 NOTE — Evaluation (Signed)
Occupational Therapy Evaluation and Discharge Patient Details Name: Kaitlin Brennan MRN: [id number] DOB: [birth year] Today's Date: 01/03/2013 Time: 4098-1191 OT Time Calculation (min): 26 min  OT Assessment / Plan / Recommendation History of present illness Pt is an 79 y/o female previously independent and living alone in her apt, presenting with right femoral fracture and right inferior pubic ramus fracture after a fall. Pt is now s/p R IM nailing of femur, and is WBAT.    Clinical Impression   This 79 yo female admitted with above presents to acute OT with decreased AROM in leg, increased pain in leg with movement, decreased strength/edurance all affecting pt's PLOF. Will benefit from continued OT at SNF. Acute OT will sign off.    OT Assessment  All further OT needs can be met in the next venue of care    Follow Up Recommendations  SNF                Precautions / Restrictions Precautions Precautions: Fall Restrictions Weight Bearing Restrictions: Yes RLE Weight Bearing: Weight bearing as tolerated       ADL  Eating/Feeding: Independent Where Assessed - Eating/Feeding: Chair Grooming: Set up Where Assessed - Grooming: Supported sitting Upper Body Bathing: Set up Where Assessed - Upper Body Bathing: Supported sitting Lower Body Bathing: +2 Total assistance Lower Body Bathing: Patient Percentage: 10% Where Assessed - Lower Body Bathing: Supported sit to stand Upper Body Dressing: Moderate assistance Where Assessed - Upper Body Dressing: Unsupported sitting Lower Body Dressing: +2 Total assistance Lower Body Dressing: Patient Percentage: 0% Where Assessed - Lower Body Dressing: Supported sit to stand Toilet Transfer: +2 Total assistance Toilet Transfer: Patient Percentage: 30% Toilet Transfer Method: Surveyor, minerals: Materials engineer and Hygiene: +2 Total assistance Toileting - Clothing Manipulation and Hygiene:  Patient Percentage: 0% Where Assessed - Glass blower/designer Manipulation and Hygiene: Sit to stand from 3-in-1 or toilet Equipment Used: Gait belt;Rolling walker Transfers/Ambulation Related to ADLs: total A +2(pt=30%) stand>sit; (40%) sit>stand    OT Diagnosis: Generalized weakness;Acute pain  OT Problem List: Decreased strength;Decreased range of motion;Decreased activity tolerance;Impaired balance (sitting and/or standing);Pain;Decreased knowledge of use of DME or AE       Visit Information  Last OT Received On: 01/03/13 Assistance Needed: +2 PT/OT/SLP Co-Evaluation/Treatment: Yes OT goals addressed during session: ADL's and self-care;Strengthening/ROM History of Present Illness: Pt is an 79 y/o female previously independent and living alone in her apt, presenting with right femoral fracture and right inferior pubic ramus fracture after a fall. Pt is now s/p R IM nailing of femur, and is WBAT.        Prior Functioning     Home Living Family/patient expects to be discharged to:: Private residence Living Arrangements: Alone Communication Communication: HOH (can only understand you if you talk in her left ear)         Vision/Perception Vision - History Patient Visual Report: No change from baseline   Cognition  Cognition Arousal/Alertness: Awake/alert Behavior During Therapy: WFL for tasks assessed/performed Overall Cognitive Status: Within Functional Limits for tasks assessed    Extremity/Trunk Assessment Upper Extremity Assessment Upper Extremity Assessment: Generalized weakness     Mobility Bed Mobility Bed Mobility: Supine to Sit;Sitting - Scoot to Edge of Bed Supine to Sit: 1: +2 Total assist Supine to Sit: Patient Percentage: 20% Sitting - Scoot to Edge of Bed: 1: +2 Total assist Sitting - Scoot to Edge of Bed: Patient Percentage: 30% Details for Bed Mobility Assistance:  VC's for sequencing and technique. Hand over hand assist to reach for bed rails. Bed pad  used for a majority of the transition to EOB. Transfers Transfers: Sit to Stand;Stand to Sit Sit to Stand: 1: +2 Total assist;From bed;With upper extremity assist Sit to Stand: Patient Percentage: 30% Stand to Sit: 1: +2 Total assist;To chair/3-in-1;With upper extremity assist Stand to Sit: Patient Percentage: 40% Details for Transfer Assistance: Step-by-step VC's in L ear for transfer sequencing and safety awareness with the RW. Pt requires increased time for transfer to recliner.     Exercise     Balance Balance Balance Assessed: Yes Static Sitting Balance Static Sitting - Balance Support: Feet supported;Bilateral upper extremity supported Static Sitting - Level of Assistance: 4: Min assist Static Sitting - Comment/# of Minutes: 5 minutes while getting all equipment repositioned Static Standing Balance Static Standing - Balance Support: Bilateral upper extremity supported Static Standing - Level of Assistance: 4: Min assist   End of Session OT - End of Session Equipment Utilized During Treatment: Gait belt;Rolling walker Activity Tolerance: Patient limited by fatigue;Patient limited by pain Patient left: in chair;with call bell/phone within reach Nurse Communication: Mobility status       Evette Georges [phone] 01/03/2013, 1:24 PM

## 2013-01-03 NOTE — Progress Notes (Signed)
[facility] PROGRESS NOTE  Mattisen Pohlmann RUE:[id number] DOB: December 31, 1921 DOA: 01/01/2013 PCP: No PCP Per Patient   Brief narrative 79 y/o female with hx of HTN and DM not on meds and not following with PCP brought by son after found to be on the floor by her son. She had sustained a right intertrochanteric fracture of femur adn fx of inferior pubic rami.   Assessment/Plan:  Close comminuted right intertrochanteric fracture of proximal femur  Secondary to mechanical fall. Patient also has a right inferior pubic rami fracture. orthopedics consulted and had IM nailing done on 12/4 . Plan to treat pubic rami fracture nonoperatively.  -Pain control . PT eval  -foley dced  patient appears better oriented today. Low vitamin D levels noted ( 10) . Will supplement with weekly vitamin D  Pubic rami fracture  treat non operatively   Elevated blood pressure  On when necessary hydralazine. Has history of hypertension but not on any medications   Diabetes mellitus  Not on any medications. Continue sliding scale insulin. A1C of 7.6  Rhabdomyolysis  Improved  with IV fluids  Large skin ulcer over lateral left neck  Patient reported that she did not want to be treated. Wound consult appreciated Patient does not have a PCP in the community and will need one upon discharge.   Code Status: DO NOT RESUSCITATE  Family Communication: none at bedside  Disposition Plan: needs skilled nursing facility . Likely early next week  Consultants:  Orthopedics ( Dr Roda Shutters)  Procedures:  Right IM nailing on 12/4 Antibiotics:  None  HPI/Subjective:  Patient appears more oriented today. worked with PT. Denies any pain   Objective: Filed Vitals:   01/03/13 1300  BP: 124/49  Pulse: 89  Temp: 99.9 F (37.7 C)  Resp: 18    Intake/Output Summary (Last 24 hours) at 01/03/13 1458 Last data filed at 01/03/13 1478  Gross per 24 hour  Intake   2620 ml  Output   1000 ml  Net   1620 ml   Filed  Weights   01/02/13 0255  Weight: 76 kg (167 lb 8.8 oz)    Exam:  General: Elderly female lying in bed in no acute distress  HEENT: No pallor, moist oral mucosa, multiple facial lesion with a large ulcer over left side of the neck below the ear   Cardiovascular: NS1&S2, no murmurs rub or gallop  Chest: Clear to auscultation bilaterally, no added sounds Abdomen: Soft, nontender, nondistended, bowel sounds present, increased  Extremity: dressing over right hip [id number]:[id number] , better oriented today. non focal    Data Reviewed: Basic Metabolic Panel:  Recent Labs Lab 01/01/13 2305 01/02/13 0354 01/03/13 0549  NA 138 140 141  K 3.9 3.8 3.6  CL 104 107 111  CO2 21 19 23   GLUCOSE 231* 208* 177*  BUN 13 12 13   CREATININE 0.65 0.59 0.68  CALCIUM 8.8 8.5 7.7*   Liver Function Tests:  Recent Labs Lab 01/02/13 0354  AST 36  ALT 17  ALKPHOS 86  BILITOT 0.9  PROT 6.6  ALBUMIN 2.9*   No results found for this basename: LIPASE, AMYLASE,  in the last 168 hours No results found for this basename: AMMONIA,  in the last 168 hours CBC:  Recent Labs Lab 01/01/13 2305 01/03/13 0549  WBC 24.9* 12.4*  NEUTROABS 22.4*  --   HGB 12.4 9.0*  HCT 36.5 26.6*  MCV 84.3 85.3  PLT 232 159   Cardiac Enzymes:  Recent Labs Lab  01/01/13 2305 01/02/13 0354 01/03/13 0549  CKTOTAL 875* 744* 340*  TROPONINI  --  <0.30  --    BNP (last 3 results) No results found for this basename: PROBNP,  in the last 8760 hours CBG:  Recent Labs Lab 01/02/13 2111 01/02/13 2347 01/03/13 0404 01/03/13 0829 01/03/13 1129  GLUCAP 212* 183* 218* 171* 216*    Recent Results (from the past 240 hour(s))  CULTURE, BLOOD (ROUTINE X 2)     Status: None   Collection Time    01/02/13  1:34 AM      Result Value Range Status   Specimen Description BLOOD RIGHT ARM   Final   Special Requests BOTTLES DRAWN AEROBIC AND ANAEROBIC 10CC EACH   Final   Culture  Setup Time     Final   Value: 01/02/2013 08:08      Performed at [facility]   Culture     Final   Value:        BLOOD CULTURE RECEIVED NO GROWTH TO DATE CULTURE WILL BE HELD FOR 5 DAYS BEFORE ISSUING A FINAL NEGATIVE REPORT     Performed at [facility]   Report Status PENDING   Incomplete  CULTURE, BLOOD (ROUTINE X 2)     Status: None   Collection Time    01/02/13  1:40 AM      Result Value Range Status   Specimen Description BLOOD RIGHT HAND   Final   Special Requests BOTTLES DRAWN AEROBIC ONLY 2CC   Final   Culture  Setup Time     Final   Value: 01/02/2013 08:08     Performed at [facility]   Culture     Final   Value:        BLOOD CULTURE RECEIVED NO GROWTH TO DATE CULTURE WILL BE HELD FOR 5 DAYS BEFORE ISSUING A FINAL NEGATIVE REPORT     Performed at [facility]   Report Status PENDING   Incomplete  URINE CULTURE     Status: None   Collection Time    01/02/13  3:27 AM      Result Value Range Status   Specimen Description URINE, RANDOM   Final   Special Requests NONE   Final   Culture  Setup Time     Final   Value: 01/02/2013 05:03     Performed at [facility] Count     Final   Value: NO GROWTH     Performed at [facility]   Culture     Final   Value: NO GROWTH     Performed at [facility]   Report Status 01/03/2013 FINAL   Final  SURGICAL PCR SCREEN     Status: None   Collection Time    01/02/13 10:17 AM      Result Value Range Status   MRSA, PCR NEGATIVE  NEGATIVE Final   Staphylococcus aureus NEGATIVE  NEGATIVE Final   Comment:            The Xpert SA Assay (FDA     approved for NASAL specimens     in patients over 59 years of age),     is one component of     a comprehensive surveillance     program.  Test performance has     been validated by [employer] for patients greater     than or equal to 1 year  old.     It is not intended     to diagnose infection nor to     guide or monitor treatment.     Studies: Dg Hip Complete  Right  01-17-13   CLINICAL DATA:  Fall with right hip pain.  EXAM: RIGHT HIP - COMPLETE 2+ VIEW  COMPARISON:  None.  FINDINGS: Moderate osteopenia. Degenerative irregularity of the symphysis pubis.  Comminuted inter trochanteric right femur fracture, with separate lesser trochanteric fracture fragment.  Possible minimally displaced right inferior pubic ramus fracture.  IMPRESSION: Comminuted intertrochanteric right femur fracture.  Possible right inferior pubic ramus minimally displaced fracture.   Electronically Signed   By: Jeronimo Greaves M.D.   On: 01/17/13 00:27   Dg Hip Operative Right  2013/01/17   CLINICAL DATA:  Fixation of intertrochanteric femur fracture.  EXAM: DG OPERATIVE RIGHT HIP  TECHNIQUE: A single spot fluoroscopic AP image of the right hip is submitted.  COMPARISON:  1 day prior  FINDINGS: 3 intraoperative images. These demonstrate placement of a dynamic fixation device across the previously described intra trochanteric femur fracture. No new fracture or acute hardware complication identified. Separate lesser trochanteric fracture fragment again seen.  IMPRESSION: Intraoperative imaging of proximal femoral fixation.   Electronically Signed   By: Jeronimo Greaves M.D.   On: 01-17-13 17:48   Ct Head Wo Contrast  January 17, 2013   CLINICAL DATA:  Fall at home.  Injury to right eye.  EXAM: CT HEAD WITHOUT CONTRAST  CT CERVICAL SPINE WITHOUT CONTRAST  TECHNIQUE: Multidetector CT imaging of the head and cervical spine was performed following the standard protocol without intravenous contrast. Multiplanar CT image reconstructions of the cervical spine were also generated.  COMPARISON:  None.  FINDINGS: CT HEAD FINDINGS  Sinuses/Soft tissues: Right supraorbital soft tissue injury. Normal appearance of the orbits and globes, without retrobulbar hemorrhage. There is also soft tissue swelling about the posterior vertex on image 56/series 5 and possible soft tissue swelling about the high left frontal scalp  on image 54/series 4.  No underlying orbital fracture. Hyperostosis frontalis interna. No skull fracture. Clear mastoid air cells. Mucosal thickening of ethmoid air cells.  Intracranial: Expected cerebral atrophy. No mass lesion, hemorrhage, hydrocephalus, acute infarct, intra-axial, or extra-axial fluid collection.  CT CERVICAL SPINE FINDINGS  Spinal visualization through the bottom of T3. Prevertebral soft tissues are within normal limits. No apical pneumothorax.  Nodule in the left parotid gland which measures 1.3 cm on image 42/series 7  Cervical spondylosis with areas of bilateral neural foraminal narrowing, primarily at C5-6.  Skull base intact. Maintenance of vertebral body height and alignment. Facets are well-aligned. Coronal reformats demonstrate a normal C1-C2 articulation.  IMPRESSION: 1. Right supraorbital and scalp soft tissue swelling without acute intracranial abnormality. 2. No acute fracture or subluxation within the cervical spine. 3. A left parotid gland nodule which is indeterminate. Of questionable clinical significance, given patient age.   Electronically Signed   By: Jeronimo Greaves M.D.   On: 01/17/2013 00:25   Ct Cervical Spine Wo Contrast  Jan 17, 2013   CLINICAL DATA:  Fall at home.  Injury to right eye.  EXAM: CT HEAD WITHOUT CONTRAST  CT CERVICAL SPINE WITHOUT CONTRAST  TECHNIQUE: Multidetector CT imaging of the head and cervical spine was performed following the standard protocol without intravenous contrast. Multiplanar CT image reconstructions of the cervical spine were also generated.  COMPARISON:  None.  FINDINGS: CT HEAD FINDINGS  Sinuses/Soft tissues: Right supraorbital soft tissue injury. Normal appearance of  the orbits and globes, without retrobulbar hemorrhage. There is also soft tissue swelling about the posterior vertex on image 56/series 5 and possible soft tissue swelling about the high left frontal scalp on image 54/series 4.  No underlying orbital fracture. Hyperostosis  frontalis interna. No skull fracture. Clear mastoid air cells. Mucosal thickening of ethmoid air cells.  Intracranial: Expected cerebral atrophy. No mass lesion, hemorrhage, hydrocephalus, acute infarct, intra-axial, or extra-axial fluid collection.  CT CERVICAL SPINE FINDINGS  Spinal visualization through the bottom of T3. Prevertebral soft tissues are within normal limits. No apical pneumothorax.  Nodule in the left parotid gland which measures 1.3 cm on image 42/series 7  Cervical spondylosis with areas of bilateral neural foraminal narrowing, primarily at C5-6.  Skull base intact. Maintenance of vertebral body height and alignment. Facets are well-aligned. Coronal reformats demonstrate a normal C1-C2 articulation.  IMPRESSION: 1. Right supraorbital and scalp soft tissue swelling without acute intracranial abnormality. 2. No acute fracture or subluxation within the cervical spine. 3. A left parotid gland nodule which is indeterminate. Of questionable clinical significance, given patient age.   Electronically Signed   By: Jeronimo Greaves M.D.   On: 01/02/2013 00:25   Dg Chest Portable 1 View  01/02/2013   CLINICAL DATA:  Fall with hip injury.  Leukocytosis.  EXAM: PORTABLE CHEST - 1 VIEW  COMPARISON:  None.  FINDINGS: Osteopenia.  Patient rotated to the right. Artifact over the upper chest. Mild cardiomegaly. No pleural effusion or pneumothorax. No lobar consolidation. Lower lobe and peripheral predominant interstitial opacities.  IMPRESSION: Lower lobe and peripheral predominant interstitial opacities, suspicious for interstitial lung disease, likely pulmonary fibrosis.  No acute superimposed process.   Electronically Signed   By: Jeronimo Greaves M.D.   On: 01/02/2013 01:53   Dg Hip Portable 1 View Right  01/02/2013   CLINICAL DATA:  Postop right hip surgery  EXAM: PORTABLE RIGHT HIP - 1 VIEW  COMPARISON:  Portable exam 1729 hr compared to intraoperative images 12 04/2012  FINDINGS: Osseous demineralization.  IM  nail with compression screw in proximal right femur across an intertrochanteric fracture.  Displaced lesser trochanteric fragment noted.  Again identified nondisplaced fracture at the right inferior pubic ramus.  Minimal narrowing of right hip joint.  Remainder of pelvis appears intact.  IMPRESSION: Post ORIF of a displaced intertrochanteric fracture of the right femur.  Nondisplaced fracture inferior right pubic ramus.   Electronically Signed   By: Ulyses Southward M.D.   On: 01/02/2013 18:07   Dg Femur Right Port  01/02/2013   CLINICAL DATA:  Lateral view of the femoral shaft  EXAM: PORTABLE RIGHT FEMUR - 2 VIEW.  The patient refused the AP view  COMPARISON:  None.  FINDINGS: There is a fracture of the intertrochanteric -sub trochanteric region of the right femur. Avulsion of the lesser trochanter is demonstrated. The femoral head and neck cannot be adequately assessed on this single film. The femoral diaphysis appears intact where visualized.  IMPRESSION: This is a very limited single view exam. At the superior margin of the study partially imaged is a comminuted angulated intertrochanteric-sub trochanteric fracture of the proximal right femur with avulsion of the lesser trochanter.   Electronically Signed   By: David  [place]   On: 01/02/2013 08:07   Dg Knee Right Port  01/02/2013   CLINICAL DATA:  Recent fall with hip fracture  EXAM: PORTABLE RIGHT KNEE - 1-2 VIEW  COMPARISON:  None.  FINDINGS: There is degenerative joint disease of the  right knee primarily involving the medial compartment where there is loss of joint space and sclerosis. No acute fracture is seen. No joint effusion is noted.  IMPRESSION: Degenerative change primarily involving the medial compartment. No acute fracture.   Electronically Signed   By: Dwyane Dee M.D.   On: 01/02/2013 08:16    Scheduled Meds: . aspirin EC  325 mg Oral BID  . calcium-vitamin D  1 tablet Oral Q breakfast  . insulin aspart  0-9 Units Subcutaneous TID WC  .  senna  1 tablet Oral BID   Continuous Infusions: . sodium chloride 125 mL/hr at 01/03/13 0549     Time spent: 25 minutes    Sojourner Behringer  [facility] Pager [phone] If 7PM-7AM, please contact night-coverage at [email or web address], password [facility] 01/03/2013, 2:58 PM  LOS: 2 days

## 2013-01-03 NOTE — Progress Notes (Signed)
   Subjective:  Patient reports pain as mild.  No events  Objective:   VITALS:   Filed Vitals:   01/02/13 2000 01/02/13 2221 01/03/13 0644 01/03/13 0738  BP:  154/72 146/60   Pulse:  92 90   Temp:  98.7 F (37.1 C) 98.9 F (37.2 C)   TempSrc:   Axillary   Resp: 16 18 18 16   Height:      Weight:      SpO2: 97% 98% 97% 98%    Neurologically intact Neurovascular intact Sensation intact distally Intact pulses distally Dorsiflexion/Plantar flexion intact Incision: dressing C/D/I and no drainage No cellulitis present Compartment soft   Lab Results  Component Value Date   WBC 12.4* 01/03/2013   HGB 9.0* 01/03/2013   HCT 26.6* 01/03/2013   MCV 85.3 01/03/2013   PLT 159 01/03/2013     Assessment/Plan: 1 Day Post-Op   Problem List Items Addressed This Visit     Musculoskeletal and Integument   *Closed comminuted intertrochanteric fracture of proximal femur - Primary   Relevant Orders      Weight bearing as tolerated     Other   Hyperglycemia   Elevated blood pressure   Leucocytosis      Up with PT/OT DVT ppx - SCDs, ambulation, aspirin WBAT right and lower extremity Pain control   Cheral Almas 01/03/2013, 7:56 AM [phone]

## 2013-01-04 DIAGNOSIS — E119 Type 2 diabetes mellitus without complications: Secondary | ICD-10-CM

## 2013-01-04 LAB — URINALYSIS, ROUTINE W REFLEX MICROSCOPIC
Bilirubin Urine: NEGATIVE
Glucose, UA: NEGATIVE mg/dL
Hgb urine dipstick: NEGATIVE
Protein, ur: NEGATIVE mg/dL
Specific Gravity, Urine: 1.025 (ref 1.005–1.030)
Urobilinogen, UA: 0.2 mg/dL (ref 0.0–1.0)
pH: 5 (ref 5.0–8.0)

## 2013-01-04 LAB — GLUCOSE, CAPILLARY
Glucose-Capillary: 109 mg/dL — ABNORMAL HIGH (ref 70–99)
Glucose-Capillary: 129 mg/dL — ABNORMAL HIGH (ref 70–99)
Glucose-Capillary: 146 mg/dL — ABNORMAL HIGH (ref 70–99)

## 2013-01-04 LAB — CBC
MCH: 28.2 pg (ref 26.0–34.0)
MCHC: 32.1 g/dL (ref 30.0–36.0)
MCV: 87.8 fL (ref 78.0–100.0)
Platelets: 167 10*3/uL (ref 150–400)
RBC: 2.87 MIL/uL — ABNORMAL LOW (ref 3.87–5.11)
RDW: 15.2 % (ref 11.5–15.5)

## 2013-01-04 LAB — BASIC METABOLIC PANEL
CO2: 22 mEq/L (ref 19–32)
Chloride: 111 mEq/L (ref 96–112)
Creatinine, Ser: 0.71 mg/dL (ref 0.50–1.10)
GFR calc non Af Amer: 74 mL/min — ABNORMAL LOW (ref >90)
Potassium: 3.5 mEq/L (ref 3.5–5.1)
Sodium: 142 mEq/L (ref 135–145)

## 2013-01-04 NOTE — Progress Notes (Signed)
Pt has not spontaneously void after foley dc`d yesterday. In and out cath done and noted with 400cc amber urine output.

## 2013-01-04 NOTE — Progress Notes (Signed)
[facility] PROGRESS NOTE  Kaitlin Brennan VWU:[id number] DOB: November 23, 1921 DOA: 01/01/2013 PCP: No PCP Per Patient  Brief narrative  79 y/o female with hx of HTN and DM not on meds and not following with PCP brought by son after found to be on the floor by her son. She had sustained a right intertrochanteric fracture of femur adn fx of inferior pubic rami.   Assessment/Plan:  Close comminuted right intertrochanteric fracture of proximal femur  Secondary to mechanical fall. Patient also has a right inferior pubic rami fracture. orthopedics consulted and had IM nailing done on 12/4 . Plan to treat pubic rami fracture nonoperatively.  -Pain control . PT eval  patient appears better oriented  now.   Low vitamin D levels noted ( 10) . Will supplement with weekly vitamin D   Pubic rami fracture  treat non operatively   Elevated blood pressure  On when necessary hydralazine. Has history of hypertension but not on any medications   Diabetes mellitus  Not on any medications. Continue sliding scale insulin. A1C of 7.6   Rhabdomyolysis  Improved with IV fluids .  dysuria In and out catheter done and then 400 cc urine. was not able to void after her Foley removed yesterday. Will place a Foley for another 24 hours and reevaluate tomorrow.    Large skin ulcer over lateral left neck  Patient reported that she did not want to be treated. Wound consult appreciated  Patient does not have a PCP in the community and will need one upon discharge.    Code Status: DO NOT RESUSCITATE  Family Communication: Son at bedside  Disposition Plan: needs skilled nursing facility . Likely early next week    Consultants:  Orthopedics ( Dr Roda Shutters)  Procedures:  Right IM nailing on 12/4  Antibiotics:  None   HPI/Subjective:  Patient unable to void a Foley DC'd yesterday. In and out catheter with 400 cc urine. Reordered for foley. Fluid rate decreased     Objective: Filed Vitals:   01/04/13  0800  BP:   Pulse:   Temp:   Resp: 18    Intake/Output Summary (Last 24 hours) at 01/04/13 1319 Last data filed at 01/04/13 0500  Gross per 24 hour  Intake    480 ml  Output    400 ml  Net     80 ml   Filed Weights   01/02/13 0255  Weight: 76 kg (167 lb 8.8 oz)    Exam:  General: Elderly female lying in bed in no acute distress  HEENT: No pallor, moist oral mucosa, multiple facial lesion with a large ulcer over left side of the neck below the ear  Cardiovascular: NS1&S2, no murmurs rub or gallop  Chest: Clear to auscultation bilaterally, no added sounds Abdomen: Soft, nontender, nondistended, bowel sounds present, Extremity: dressing over right hip  GNF:[id number] ,non focal    Data Reviewed: Basic Metabolic Panel:  Recent Labs Lab 01/01/13 2305 01/02/13 0354 01/03/13 0549 01/04/13 0415  NA 138 140 141 142  K 3.9 3.8 3.6 3.5  CL 104 107 111 111  CO2 21 19 23 22   GLUCOSE 231* 208* 177* 136*  BUN 13 12 13 17   CREATININE 0.65 0.59 0.68 0.71  CALCIUM 8.8 8.5 7.7* 7.4*   Liver Function Tests:  Recent Labs Lab 01/02/13 0354  AST 36  ALT 17  ALKPHOS 86  BILITOT 0.9  PROT 6.6  ALBUMIN 2.9*   No results found for this basename: LIPASE, AMYLASE,  in the last 168 hours No results found for this basename: AMMONIA,  in the last 168 hours CBC:  Recent Labs Lab 01/01/13 2305 01/03/13 0549 01/04/13 0415  WBC 24.9* 12.4* 11.6*  NEUTROABS 22.4*  --   --   HGB 12.4 9.0* 8.1*  HCT 36.5 26.6* 25.2*  MCV 84.3 85.3 87.8  PLT 232 159 167   Cardiac Enzymes:  Recent Labs Lab 01/01/13 2305 01/02/13 0354 01/03/13 0549  CKTOTAL 875* 744* 340*  TROPONINI  --  <0.30  --    BNP (last 3 results) No results found for this basename: PROBNP,  in the last 8760 hours CBG:  Recent Labs Lab 01/03/13 2007 01/04/13 0005 01/04/13 0401 01/04/13 0811 01/04/13 1207  GLUCAP 174* 129* 120* 109* 148*    Recent Results (from the past 240 hour(s))  CULTURE, BLOOD (ROUTINE  X 2)     Status: None   Collection Time    01/02/13  1:34 AM      Result Value Range Status   Specimen Description BLOOD RIGHT ARM   Final   Special Requests BOTTLES DRAWN AEROBIC AND ANAEROBIC 10CC EACH   Final   Culture  Setup Time     Final   Value: 01/02/2013 08:08     Performed at [facility]   Culture     Final   Value:        BLOOD CULTURE RECEIVED NO GROWTH TO DATE CULTURE WILL BE HELD FOR 5 DAYS BEFORE ISSUING A FINAL NEGATIVE REPORT     Performed at [facility]   Report Status PENDING   Incomplete  CULTURE, BLOOD (ROUTINE X 2)     Status: None   Collection Time    01/02/13  1:40 AM      Result Value Range Status   Specimen Description BLOOD RIGHT HAND   Final   Special Requests BOTTLES DRAWN AEROBIC ONLY 2CC   Final   Culture  Setup Time     Final   Value: 01/02/2013 08:08     Performed at [facility]   Culture     Final   Value:        BLOOD CULTURE RECEIVED NO GROWTH TO DATE CULTURE WILL BE HELD FOR 5 DAYS BEFORE ISSUING A FINAL NEGATIVE REPORT     Performed at [facility]   Report Status PENDING   Incomplete  URINE CULTURE     Status: None   Collection Time    01/02/13  3:27 AM      Result Value Range Status   Specimen Description URINE, RANDOM   Final   Special Requests NONE   Final   Culture  Setup Time     Final   Value: 01/02/2013 05:03     Performed at [facility] Count     Final   Value: NO GROWTH     Performed at [facility]   Culture     Final   Value: NO GROWTH     Performed at [facility]   Report Status 01/03/2013 FINAL   Final  SURGICAL PCR SCREEN     Status: None   Collection Time    01/02/13 10:17 AM      Result Value Range Status   MRSA, PCR NEGATIVE  NEGATIVE Final   Staphylococcus aureus NEGATIVE  NEGATIVE Final   Comment:            The Xpert SA  Assay (FDA     approved for NASAL specimens     in patients over 71 years of age),     is one component of     a  comprehensive surveillance     program.  Test performance has     been validated by [employer] for patients greater     than or equal to 50 year old.     It is not intended     to diagnose infection nor to     guide or monitor treatment.     Studies: Dg Hip Operative Right  01/12/2013   CLINICAL DATA:  Fixation of intertrochanteric femur fracture.  EXAM: DG OPERATIVE RIGHT HIP  TECHNIQUE: A single spot fluoroscopic AP image of the right hip is submitted.  COMPARISON:  1 day prior  FINDINGS: 3 intraoperative images. These demonstrate placement of a dynamic fixation device across the previously described intra trochanteric femur fracture. No new fracture or acute hardware complication identified. Separate lesser trochanteric fracture fragment again seen.  IMPRESSION: Intraoperative imaging of proximal femoral fixation.   Electronically Signed   By: Jeronimo Greaves M.D.   On: January 12, 2013 17:48   Dg Hip Portable 1 View Right  01/12/13   CLINICAL DATA:  Postop right hip surgery  EXAM: PORTABLE RIGHT HIP - 1 VIEW  COMPARISON:  Portable exam 1729 hr compared to intraoperative images 12 04/2012  FINDINGS: Osseous demineralization.  IM nail with compression screw in proximal right femur across an intertrochanteric fracture.  Displaced lesser trochanteric fragment noted.  Again identified nondisplaced fracture at the right inferior pubic ramus.  Minimal narrowing of right hip joint.  Remainder of pelvis appears intact.  IMPRESSION: Post ORIF of a displaced intertrochanteric fracture of the right femur.  Nondisplaced fracture inferior right pubic ramus.   Electronically Signed   By: Ulyses Southward M.D.   On: 01-12-2013 18:07    Scheduled Meds: . aspirin EC  325 mg Oral BID  . calcium-vitamin D  1 tablet Oral Q breakfast  . insulin aspart  0-9 Units Subcutaneous TID WC  . senna  1 tablet Oral BID   Continuous Infusions: . sodium chloride 75 mL/hr at 01/04/13 1155      Time spent: 25  minutes    Kaitlin Brennan  [facility] Pager  [phone]. If 7PM-7AM, please contact night-coverage at [email or web address], password [facility] 01/04/2013, 1:19 PM  LOS: 3 days

## 2013-01-04 NOTE — Progress Notes (Addendum)
Clinical Social Work Department CLINICAL SOCIAL WORK PLACEMENT NOTE 01/04/2013  Patient:  Kaitlin Brennan, Kaitlin Brennan  Account Number:  [id number] Admit date:  01/01/2013  Clinical Social Worker:  Jetta Lout, Theresia Majors  Date/time:  01/04/2013 06:27 PM  Clinical Social Work is seeking post-discharge placement for this patient at the following level of care:   SKILLED NURSING   (*CSW will update this form in Epic as items are completed)   01/04/2013  Patient/family provided with [facility] Department of Clinical Social Work's list of facilities offering this level of care within the geographic area requested by the patient (or if unable, by the patient's family).  01/04/2013  Patient/family informed of their freedom to choose among providers that offer the needed level of care, that participate in Medicare, Medicaid or managed care program needed by the patient, have an available bed and are willing to accept the patient.  01/04/2013  Patient/family informed of [facility]' ownership interest in [facility], as well as of the fact that they are under no obligation to receive care at this facility.  PASARR submitted to EDS on 01/04/2013 PASARR number received from EDS on 01/04/2013  FL2 transmitted to all facilities in geographic area requested by pt/family on  01/04/2013 FL2 transmitted to all facilities within larger geographic area on   Patient informed that his/her managed care company has contracts with or will negotiate with  certain facilities, including the following:     Patient/family informed of bed offers received:  01/06/2013 Patient chooses bed at [facility] Physician recommends and patient chooses bed at    Patient to be transferred to [facility]  on 01/06/2013  Patient to be transferred to facility by [facility]  The following physician request were entered in Epic:   Additional Comments: Patient and son want a SNF in [facility]. They have no preferences at  this point.

## 2013-01-04 NOTE — Progress Notes (Signed)
Physical Therapy Treatment Patient Details Name: Kaitlin Brennan MRN: [id number] DOB: October 28, 1921 Today's Date: 01/04/2013 Time: 5284-1324 PT Time Calculation (min): 25 min  PT Assessment / Plan / Recommendation  History of Present Illness Pt is an 79 y/o female previously independent and living alone in her apt, presenting with right femoral fracture and right inferior pubic ramus fracture after a fall. Pt is now s/p R IM nailing of femur, and is WBAT. Pt. currently having alot of anxiety over participating in PT.  Have discussed this with Maralyn Sago RN and she will contact MD to see if an anti-ianxiety med prior to PT could be considered.   PT Comments   Son Alinda Money present and supportive.  He describes his mom as quirky and highly anxious/fearful even in her own home.  She was greatly limited today by anxiety and fear and would not agree to attempt OOB.  She reluctantly agreed to participate in R LE strengthening there ex.  Son believes pt. Will respond better to his wife's encouragement and will attempt to see tomorrow as schedule permits when wife and pt's son here about 3.  Discussed with Maralyn Sago, RN and she will contact MD to see if anti-anxiety may benefit pt. Prior to PT session.  Will continue as pt. Allows.  Follow Up Recommendations  SNF;Supervision/Assistance - 24 hour     Does the patient have the potential to tolerate intense rehabilitation     Barriers to Discharge        Equipment Recommendations  Other (comment) (to be addressed at next venue of care)    Recommendations for Other Services    Frequency Min 3X/week   Progress towards PT Goals Progress towards PT goals: Not progressing toward goals - comment (pt. limited by anxiousness)  Plan Current plan remains appropriate    Precautions / Restrictions Precautions Precautions: Fall Restrictions Weight Bearing Restrictions: Yes RLE Weight Bearing: Weight bearing as tolerated   Pertinent Vitals/Pain See vitals tab      Mobility  Bed Mobility Bed Mobility: Not assessed (pt. repeatedly declined) Transfers Transfers: Not assessed Ambulation/Gait Ambulation/Gait Assistance: Not tested (comment)    Exercises General Exercises - Lower Extremity Ankle Circles/Pumps: AROM;Both;Supine;15 reps Quad Sets: AROM;Both;10 reps;Supine Short Arc Quad: AAROM;Right;10 reps;Supine Heel Slides: AAROM;Right;10 reps;Supine Hip ABduction/ADduction: AAROM;Right;10 reps;Supine   PT Diagnosis:    PT Problem List:   PT Treatment Interventions:     PT Goals (current goals can now be found in the care plan section) Acute Rehab PT Goals Patient Stated Goal: To return to PLOF  Visit Information  Last PT Received On: 01/04/13 Assistance Needed: +2 PT goals addressed during session: Mobility/safety with mobility History of Present Illness: Pt is an 79 y/o female previously independent and living alone in her apt, presenting with right femoral fracture and right inferior pubic ramus fracture after a fall. Pt is now s/p R IM nailing of femur, and is WBAT. Pt. currently having alot of anxiety over participating in PT.  Have discussed this with Maralyn Sago RN and she will contact MD to see if an antianxiety med prior to PT could be considered.    Subjective Data  Subjective: "I am OK, just leave me alone"  and frequently asks "am I ok?".  She asks often if son is still present.  Son Alinda Money says pt. is at baseline very fearful of the smallest things and keeps all lights on in her house at all times due to fear.  She did want her room lights left on  bright once session over.   Patient Stated Goal: To return to PLOF   Cognition  Cognition Arousal/Alertness: Awake/alert Behavior During Therapy: Anxious Overall Cognitive Status: History of cognitive impairments - at baseline (per son, pt is usually fearful and anxious at baseline) Difficult to assess due to: Hard of hearing/deaf    Balance     End of Session PT - End of  Session Activity Tolerance: Other (comment) (limited by anxiousness) Patient left: in bed;with call bell/phone within reach;with family/visitor present;Other (comment) (son Alinda Money) Nurse Communication: Other (comment) (pt's refusal to attmept OOB, anxiousness)   GP     Ferman Hamming 01/04/2013, 12:29 PM Weldon Picking PT Acute Rehab Services [phone] Bee[place] [phone]

## 2013-01-04 NOTE — Progress Notes (Signed)
Patient ID: Kaitlin Brennan, female   DOB: Sep 28, 1921, 79 y.o.   MRN: [id number] Status post intramedullary nailing for an intertrochanteric hip fracture as well as pubic rami fracture. Patient is comfortable this morning. Anticipate discharge to skilled nursing.

## 2013-01-04 NOTE — Progress Notes (Signed)
Clinical Social Work Department BRIEF PSYCHOSOCIAL ASSESSMENT 01/04/2013  Patient:  YSABELLE, GOODROE     Account Number:  [id number]     Admit date:  01/01/2013  Clinical Social Worker:  Hendricks Milo  Date/Time:  01/04/2013 06:21 PM  Referred by:  Physician  Date Referred:  01/02/2013 Referred for  SNF Placement   Other Referral:   Interview type:  Family Other interview type:    PSYCHOSOCIAL DATA Living Status:  ALONE Admitted from facility:   Level of care:   Primary support name:  Ethelene Browns Bolda [phone] (cell) Primary support relationship to patient:  CHILD, ADULT Degree of support available:   Very supportive at bedside during assessment.    CURRENT CONCERNS  Other Concerns:    SOCIAL WORK ASSESSMENT / PLAN Clinical Social Worker (CSW) met with patient, patient's son Ethelene Browns, and [name]'s wife. Son was agreeable to SNF search in [place]. Son does not have a preference of facilities at this point. CSW encouraged son and family members to look of SNF list CSW provided and pick their top three choices and let the weekday CSW know on Monday. Son and family members verbalized their understanding.   Assessment/plan status:  Psychosocial Support/Ongoing Assessment of Needs Other assessment/ plan:   Information/referral to community resources:   CSW gave son SNF list.    PATIENT'S/FAMILY'S RESPONSE TO PLAN OF CARE: Son and his wife thanked CSW for coming to discuss SNF placement.

## 2013-01-05 LAB — CBC
Hemoglobin: 7.4 g/dL — ABNORMAL LOW (ref 12.0–15.0)
MCHC: 32.6 g/dL (ref 30.0–36.0)
Platelets: 172 10*3/uL (ref 150–400)
RBC: 2.59 MIL/uL — ABNORMAL LOW (ref 3.87–5.11)

## 2013-01-05 LAB — GLUCOSE, CAPILLARY
Glucose-Capillary: 110 mg/dL — ABNORMAL HIGH (ref 70–99)
Glucose-Capillary: 145 mg/dL — ABNORMAL HIGH (ref 70–99)

## 2013-01-05 MED ORDER — INSULIN ASPART 100 UNIT/ML ~~LOC~~ SOLN
0.0000 [IU] | Freq: Three times a day (TID) | SUBCUTANEOUS | Status: DC
  Administered 2013-01-05: 2 [IU] via SUBCUTANEOUS
  Administered 2013-01-06: 5 [IU] via SUBCUTANEOUS
  Administered 2013-01-06: 2 [IU] via SUBCUTANEOUS

## 2013-01-05 NOTE — Progress Notes (Signed)
[facility] PROGRESS NOTE  Kaitlin Brennan FAO:[id number] DOB: 05/08/1921 DOA: 01/01/2013 PCP: No PCP Per Patient  Brief narrative  79 y/o female with hx of HTN and DM not on meds and not following with PCP brought by son after found to be on the floor by her son. She had sustained a right intertrochanteric fracture of femur adn fx of inferior pubic rami.   Assessment/Plan:  Close comminuted right intertrochanteric fracture of proximal femur  Secondary to mechanical fall. Patient also has a right inferior pubic rami fracture. orthopedics consulted and had IM nailing done on 12/4 . Plan to treat pubic rami fracture nonoperatively.  -Pain control . PT eval  -DVT prophylaxis per ortho Low vitamin D levels noted ( 10) . Will supplement with weekly vitamin D   Pubic rami fracture  treat non operatively   Elevated blood pressure  On when necessary hydralazine. Has history of hypertension but not on any medications   Diabetes mellitus  Not on any medications. Continue sliding scale insulin. A1C of 7.6 . Will start on oral hypoglycemics upon discharge.  Rhabdomyolysis  Improved with IV fluids .   dysuria  Placed back on foley . Has good UOP. Will d/c foley today.   Large skin ulcer over lateral left neck  Patient reported that she did not want to be treated. Wound consult appreciated  Patient does not have a PCP in the community and will need one upon discharge. Also needs referral to dermatology.  Anemia Some drop in hb noted. Monitor in am. Will transfuse PRBC if drops further   Code Status: DO NOT RESUSCITATE  Family Communication: Son at bedside  Disposition Plan:skilled nursing facility  Likely early next week   Consultants:  Orthopedics ( Dr Roda Shutters)   Procedures:  Right IM nailing on 12/4    Antibiotics:  None    HPI/Subjective:  No overnight issues. Has good appetite   Objective: Filed Vitals:   01/05/13 0800  BP:   Pulse:   Temp:   Resp: 18     Intake/Output Summary (Last 24 hours) at 01/05/13 1536 Last data filed at 01/05/13 1200  Gross per 24 hour  Intake   1550 ml  Output    450 ml  Net   1100 ml   Filed Weights   01/02/13 0255  Weight: 76 kg (167 lb 8.8 oz)    Exam: General: Elderly female lying in bed in no acute distress  HEENT: No pallor, moist oral mucosa, multiple facial lesion with a large ulcer over left side of the neck below the ear  Cardiovascular: NS1&S2, no murmurs rub or gallop  Chest: Clear to auscultation bilaterally, no added sounds  Abdomen: Soft, nontender, nondistended, bowel sounds present, foley in place Extremity: dressing over right hip  ONG:EXBM8 ,non focal    Data Reviewed: Basic Metabolic Panel:  Recent Labs Lab 01/01/13 2305 01/02/13 0354 01/03/13 0549 01/04/13 0415  NA 138 140 141 142  K 3.9 3.8 3.6 3.5  CL 104 107 111 111  CO2 21 19 23 22   GLUCOSE 231* 208* 177* 136*  BUN 13 12 13 17   CREATININE 0.65 0.59 0.68 0.71  CALCIUM 8.8 8.5 7.7* 7.4*   Liver Function Tests:  Recent Labs Lab 01/02/13 0354  AST 36  ALT 17  ALKPHOS 86  BILITOT 0.9  PROT 6.6  ALBUMIN 2.9*   No results found for this basename: LIPASE, AMYLASE,  in the last 168 hours No results found for this basename: AMMONIA,  in the last 168 hours CBC:  Recent Labs Lab 01/01/13 2305 01/03/13 0549 01/04/13 0415 01/05/13 0230  WBC 24.9* 12.4* 11.6* 9.7  NEUTROABS 22.4*  --   --   --   HGB 12.4 9.0* 8.1* 7.4*  HCT 36.5 26.6* 25.2* 22.7*  MCV 84.3 85.3 87.8 87.6  PLT 232 159 167 172   Cardiac Enzymes:  Recent Labs Lab 01/01/13 2305 01/02/13 0354 01/03/13 0549  CKTOTAL 875* 744* 340*  TROPONINI  --  <0.30  --    BNP (last 3 results) No results found for this basename: PROBNP,  in the last 8760 hours CBG:  Recent Labs Lab 01/04/13 1207 01/04/13 1604 01/04/13 2128 01/05/13 0643 01/05/13 1110  GLUCAP 148* 127* 146* 110* 168*    Recent Results (from the past 240 hour(s))   CULTURE, BLOOD (ROUTINE X 2)     Status: None   Collection Time    01/02/13  1:34 AM      Result Value Range Status   Specimen Description BLOOD RIGHT ARM   Final   Special Requests BOTTLES DRAWN AEROBIC AND ANAEROBIC 10CC EACH   Final   Culture  Setup Time     Final   Value: 01/02/2013 08:08     Performed at [facility]   Culture     Final   Value:        BLOOD CULTURE RECEIVED NO GROWTH TO DATE CULTURE WILL BE HELD FOR 5 DAYS BEFORE ISSUING A FINAL NEGATIVE REPORT     Performed at [facility]   Report Status PENDING   Incomplete  CULTURE, BLOOD (ROUTINE X 2)     Status: None   Collection Time    01/02/13  1:40 AM      Result Value Range Status   Specimen Description BLOOD RIGHT HAND   Final   Special Requests BOTTLES DRAWN AEROBIC ONLY 2CC   Final   Culture  Setup Time     Final   Value: 01/02/2013 08:08     Performed at [facility]   Culture     Final   Value:        BLOOD CULTURE RECEIVED NO GROWTH TO DATE CULTURE WILL BE HELD FOR 5 DAYS BEFORE ISSUING A FINAL NEGATIVE REPORT     Performed at [facility]   Report Status PENDING   Incomplete  URINE CULTURE     Status: None   Collection Time    01/02/13  3:27 AM      Result Value Range Status   Specimen Description URINE, RANDOM   Final   Special Requests NONE   Final   Culture  Setup Time     Final   Value: 01/02/2013 05:03     Performed at [facility] Count     Final   Value: NO GROWTH     Performed at [facility]   Culture     Final   Value: NO GROWTH     Performed at [facility]   Report Status 01/03/2013 FINAL   Final  SURGICAL PCR SCREEN     Status: None   Collection Time    01/02/13 10:17 AM      Result Value Range Status   MRSA, PCR NEGATIVE  NEGATIVE Final   Staphylococcus aureus NEGATIVE  NEGATIVE Final   Comment:            The Xpert SA Assay (FDA  approved for NASAL specimens     in patients over 100 years of age),     is one  component of     a comprehensive surveillance     program.  Test performance has     been validated by [employer] for patients greater     than or equal to 34 year old.     It is not intended     to diagnose infection nor to     guide or monitor treatment.     Studies: No results found.  Scheduled Meds: . aspirin EC  325 mg Oral BID  . calcium-vitamin D  1 tablet Oral Q breakfast  . insulin aspart  0-15 Units Subcutaneous TID WC  . senna  1 tablet Oral BID   Continuous Infusions: . sodium chloride 75 mL/hr at 01/05/13 0600      Time spent: 25 minutes    Kaitlin Brennan  [facility] Pager [phone]. If 7PM-7AM, please contact night-coverage at [email or web address], password [facility] 01/05/2013, 3:36 PM  LOS: 4 days

## 2013-01-06 DIAGNOSIS — D62 Acute posthemorrhagic anemia: Secondary | ICD-10-CM

## 2013-01-06 DIAGNOSIS — E559 Vitamin D deficiency, unspecified: Secondary | ICD-10-CM | POA: Diagnosis present

## 2013-01-06 LAB — GLUCOSE, CAPILLARY
Glucose-Capillary: 123 mg/dL — ABNORMAL HIGH (ref 70–99)
Glucose-Capillary: 203 mg/dL — ABNORMAL HIGH (ref 70–99)

## 2013-01-06 LAB — CBC
HCT: 23.8 % — ABNORMAL LOW (ref 36.0–46.0)
Hemoglobin: 7.7 g/dL — ABNORMAL LOW (ref 12.0–15.0)
MCH: 27.6 pg (ref 26.0–34.0)
MCHC: 32.4 g/dL (ref 30.0–36.0)
MCV: 85.3 fL (ref 78.0–100.0)
RBC: 2.79 MIL/uL — ABNORMAL LOW (ref 3.87–5.11)

## 2013-01-06 MED ORDER — CALCIUM CARBONATE-VITAMIN D 500-200 MG-UNIT PO TABS: 1.0000 | ORAL_TABLET | Freq: Every day | ORAL | Status: DC

## 2013-01-06 MED ORDER — METHOCARBAMOL 500 MG PO TABS: 500.0000 mg | ORAL_TABLET | Freq: Four times a day (QID) | ORAL | Status: DC | PRN

## 2013-01-06 MED ORDER — VITAMIN D (ERGOCALCIFEROL) 1.25 MG (50000 UNIT) PO CAPS: 50000.0000 [IU] | ORAL_CAPSULE | ORAL | Status: DC

## 2013-01-06 MED ORDER — WHITE PETROLATUM GEL
Status: AC
  Administered 2013-01-06: 0.2
  Filled 2013-01-06: qty 5

## 2013-01-06 MED ORDER — VITAMIN D (ERGOCALCIFEROL) 1.25 MG (50000 UNIT) PO CAPS
50000.0000 [IU] | ORAL_CAPSULE | ORAL | Status: DC
  Administered 2013-01-06: 50000 [IU] via ORAL
  Filled 2013-01-06: qty 1

## 2013-01-06 MED ORDER — SENNA 8.6 MG PO TABS: 1.0000 | ORAL_TABLET | Freq: Two times a day (BID) | ORAL | Status: DC

## 2013-01-06 MED ORDER — METFORMIN HCL 500 MG PO TABS: 500.0000 mg | ORAL_TABLET | Freq: Two times a day (BID) | ORAL | Status: DC

## 2013-01-06 NOTE — Progress Notes (Signed)
Physical Therapy Treatment Patient Details Name: Kaitlin Brennan MRN: [id number] DOB: May 29, 1921 Today's Date: 01/06/2013 Time: 7829-5621 PT Time Calculation (min): 15 min  PT Assessment / Plan / Recommendation  History of Present Illness Pt is an 79 y/o female previously independent and living alone in her apt, presenting with right femoral fracture and right inferior pubic ramus fracture after a fall. Pt is now s/p R IM nailing of femur, and is WBAT. Pt. currently having alot of anxiety over participating in PT.  Have discussed this with Maralyn Sago RN and she will contact MD to see if an antianxiety med prior to PT could be considered.   PT Comments   PT session attempted in morning and afternoon with this pt, both times pt got very upset at the thought of getting out of the bed or moving her RLE at all. In afternoon pt agreed to bed exercise after max encouragement from therapist. All exercise was with heavy active assist, and pt is showing little progress with active movement. At end of session pt reports she "maybe should have listened", regarding getting into the recliner. When offered again, pt declined.   Follow Up Recommendations  SNF;Supervision/Assistance - 24 hour     Does the patient have the potential to tolerate intense rehabilitation     Barriers to Discharge        Equipment Recommendations  Other (comment) (TBD by next venue of care)    Recommendations for Other Services    Frequency Min 3X/week   Progress towards PT Goals Progress towards PT goals: Not progressing toward goals - comment (Pt limited by anxiousness)  Plan Current plan remains appropriate    Precautions / Restrictions Precautions Precautions: Fall Restrictions Weight Bearing Restrictions: Yes RLE Weight Bearing: Weight bearing as tolerated   Pertinent Vitals/Pain Pt does not rate pain on 0-10 scale, however states that her RLE hurts during therapeutic exercise    Mobility  Bed Mobility Bed Mobility:  Not assessed Details for Bed Mobility Assistance: Pt refused to attempt bed mobility at this time.  Transfers Transfers: Not assessed Details for Transfer Assistance: Pt refused to attempt transfers at this time Ambulation/Gait Ambulation/Gait Assistance: Not tested (comment)    Exercises General Exercises - Lower Extremity Ankle Circles/Pumps: 10 reps Quad Sets: 10 reps Short Arc Quad: 10 reps Heel Slides: 10 reps Hip ABduction/ADduction: 10 reps   PT Diagnosis:    PT Problem List:   PT Treatment Interventions:     PT Goals (current goals can now be found in the care plan section) Acute Rehab PT Goals Patient Stated Goal: To return to PLOF PT Goal Formulation: With patient/family Time For Goal Achievement: 01/10/13 Potential to Achieve Goals: Good  Visit Information  Last PT Received On: 01/06/13 Assistance Needed: +2 History of Present Illness: Pt is an 79 y/o female previously independent and living alone in her apt, presenting with right femoral fracture and right inferior pubic ramus fracture after a fall. Pt is now s/p R IM nailing of femur, and is WBAT. Pt. currently having alot of anxiety over participating in PT.  Have discussed this with Maralyn Sago RN and she will contact MD to see if an antianxiety med prior to PT could be considered.    Subjective Data  Subjective: "I just want to lay here. Can you come back tomorrow instead?" Patient Stated Goal: To return to PLOF   Cognition  Cognition Arousal/Alertness: Awake/alert Behavior During Therapy: Anxious Overall Cognitive Status: History of cognitive impairments - at baseline Difficult  to assess due to: Hard of hearing/deaf    Balance  Balance Balance Assessed: No  End of Session PT - End of Session Activity Tolerance: Patient limited by pain;Patient limited by lethargy Patient left: in bed;with call bell/phone within reach Nurse Communication: Mobility status   GP     Ruthann Cancer 01/06/2013, 3:54  PM  Ruthann Cancer, PT, DPT [phone]

## 2013-01-06 NOTE — Progress Notes (Signed)
Reprot called to receiving nurse at [facility]. Patient transported via ambulance and in stable condition. Copy of chart and script sent with patient.-

## 2013-01-06 NOTE — Discharge Summary (Signed)
Physician Discharge Summary  Kaitlin Brennan YNW:[id number] DOB: [birth year] DOA: 01/01/2013  PCP: No PCP Per Patient  Admit date: 01/01/2013 Discharge date: 01/06/2013  Time spent: 40  minutes  Recommendations for Outpatient Follow-up:  -D/c to SNF with outpt orthopedics follow up -Patient needs outpt PCP established prior to discharge from rehab and also needs outpt dermatology referral for her neck lesions. please check H&H in 2-3 days    Discharge Diagnoses:  Principal Problem:   Closed comminuted intertrochanteric fracture of proximal femur   Active Problems:   Elevated blood pressure   Leucocytosis Pubic rami fracture   Diabetes mellitus, type 2   Postoperative anemia due to acute blood loss   Unspecified vitamin D deficiency rhabdomyolysis   Discharge Condition: fair  Diet recommendation: diabetic  Filed Weights   01/02/13 0255  Weight: 76 kg (167 lb 8.8 oz)    History of present illness:  Please refer to admission h&P for details, but in brief, 79 y/o female with hx of HTN and DM not on meds and not following with PCP brought by son after found to be on the floor by her son. She had sustained a right intertrochanteric fracture of femur adn fx of inferior pubic rami.    Hospital Course:  Close comminuted right intertrochanteric fracture of proximal femur  Secondary to mechanical fall. Patient also has a right inferior pubic rami fracture. orthopedics consulted and had IM nailing done on 12/4 . Plan to treat pubic rami fracture nonoperatively.  -Pain control . PT eval  -DVT prophylaxis with aspirin 325 mg bid Pain control with prn vicodin and robaxin for muscle spasms Low vitamin D levels noted ( 10) . Will supplement with weekly vitamin D   Pubic rami fracture  treat non operatively   Elevated blood pressure  BP currently stable. Please add when necessary hydralazine if BP elevated.   Diabetes mellitus  Not on any medications.  A1C of 7.6 . Will start  on metformin 500 mg bid upon discharge.  Rhabdomyolysis  Improved with IV fluids . Likely due to fall.   Leucocytosis  present on admission. No signs of infection. Now resolved.  dysuria  Resolved. Patient is  incontinent.  Large skin ulcer over lateral left neck  Patient reported that she did not want to be treated. Wound consult appreciated . Recommend Silicone foam non-adherent dressing to protect site from further injury.   Patient does not have a PCP in the community and will need one upon discharge. Also needs referral to dermatology.   Anemia  Some drop in hb noted which is likely post op. Recheck in 2-3 days and transfuse if necessary.  Mild to moderate dementia  monitor as outpt.   Patient note to drop her o2 sat to 88% on RA this am. Likely with atelectasis of lung. Afebrile, breath sounds clear. continue bedside spirometry. Will discharge her on 1L o2 via [place]. Titrate in SNF.  Code Status: DO NOT RESUSCITATE  Family Communication: discussed with son Disposition Plan:skilled nursing facility    Consultants:  Orthopedics ( Dr Roda Shutters)    Procedures:  Right IM nailing on 12/4    Antibiotics:  None    Discharge Exam: Filed Vitals:   01/06/13 0800  BP:   Pulse:   Temp:   Resp: 20    General: Elderly female lying in bed in no acute distress  HEENT: No pallor, moist oral mucosa, multiple facial lesion with a large ulcer over left side of the neck below  the ear  Cardiovascular: NS1&S2, no murmurs rub or gallop  Chest: Clear to auscultation bilaterally, no added sounds  Abdomen: Soft, nontender, nondistended, bowel sounds present, Extremity: dressing over right hip  OZH:[id number] ,non focal    Discharge Instructions  Discharge Orders   Future Orders Complete By Expires   Weight bearing as tolerated  As directed    Questions:     Laterality:     Extremity:         Medication List         aspirin EC 325 MG tablet  Take 1 tablet (325 mg total) by mouth  2 (two) times daily.     calcium-vitamin D 500-200 MG-UNIT per tablet  Commonly known as:  OSCAL WITH D  Take 1 tablet by mouth daily with breakfast.     HYDROcodone-acetaminophen 5-325 MG per tablet  Commonly known as:  NORCO  Take 1-2 tablets by mouth every 6 (six) hours as needed.     metFORMIN 500 MG tablet  Commonly known as:  GLUCOPHAGE  Take 1 tablet (500 mg total) by mouth 2 (two) times daily with a meal.     methocarbamol 500 MG tablet  Commonly known as:  ROBAXIN  Take 1 tablet (500 mg total) by mouth every 6 (six) hours as needed for muscle spasms.     senna 8.6 MG Tabs tablet  Commonly known as:  SENOKOT  Take 1 tablet (8.6 mg total) by mouth 2 (two) times daily.     Vitamin D (Ergocalciferol) 50000 UNITS Caps capsule  Commonly known as:  DRISDOL  Take 1 capsule (50,000 Units total) by mouth every 7 (seven) days.       No Known Allergies     Follow-up Information   Follow up with Cheral Almas, MD In 2 weeks.   Specialty:  Orthopedic Surgery   Contact information:   [address] [place] [address] [phone]        The results of significant diagnostics from this hospitalization (including imaging, microbiology, ancillary and laboratory) are listed below for reference.    Significant Diagnostic Studies: Dg Hip Complete Right  01/02/2013   CLINICAL DATA:  Fall with right hip pain.  EXAM: RIGHT HIP - COMPLETE 2+ VIEW  COMPARISON:  None.  FINDINGS: Moderate osteopenia. Degenerative irregularity of the symphysis pubis.  Comminuted inter trochanteric right femur fracture, with separate lesser trochanteric fracture fragment.  Possible minimally displaced right inferior pubic ramus fracture.  IMPRESSION: Comminuted intertrochanteric right femur fracture.  Possible right inferior pubic ramus minimally displaced fracture.   Electronically Signed   By: Jeronimo Greaves M.D.   On: 01/02/2013 00:27   Dg Hip Operative Right  01/02/2013   CLINICAL DATA:   Fixation of intertrochanteric femur fracture.  EXAM: DG OPERATIVE RIGHT HIP  TECHNIQUE: A single spot fluoroscopic AP image of the right hip is submitted.  COMPARISON:  1 day prior  FINDINGS: 3 intraoperative images. These demonstrate placement of a dynamic fixation device across the previously described intra trochanteric femur fracture. No new fracture or acute hardware complication identified. Separate lesser trochanteric fracture fragment again seen.  IMPRESSION: Intraoperative imaging of proximal femoral fixation.   Electronically Signed   By: Jeronimo Greaves M.D.   On: 01/02/2013 17:48   Ct Head Wo Contrast  01/02/2013   CLINICAL DATA:  Fall at home.  Injury to right eye.  EXAM: CT HEAD WITHOUT CONTRAST  CT CERVICAL SPINE WITHOUT CONTRAST  TECHNIQUE: Multidetector CT imaging of  the head and cervical spine was performed following the standard protocol without intravenous contrast. Multiplanar CT image reconstructions of the cervical spine were also generated.  COMPARISON:  None.  FINDINGS: CT HEAD FINDINGS  Sinuses/Soft tissues: Right supraorbital soft tissue injury. Normal appearance of the orbits and globes, without retrobulbar hemorrhage. There is also soft tissue swelling about the posterior vertex on image 56/series 5 and possible soft tissue swelling about the high left frontal scalp on image 54/series 4.  No underlying orbital fracture. Hyperostosis frontalis interna. No skull fracture. Clear mastoid air cells. Mucosal thickening of ethmoid air cells.  Intracranial: Expected cerebral atrophy. No mass lesion, hemorrhage, hydrocephalus, acute infarct, intra-axial, or extra-axial fluid collection.  CT CERVICAL SPINE FINDINGS  Spinal visualization through the bottom of T3. Prevertebral soft tissues are within normal limits. No apical pneumothorax.  Nodule in the left parotid gland which measures 1.3 cm on image 42/series 7  Cervical spondylosis with areas of bilateral neural foraminal narrowing, primarily  at C5-6.  Skull base intact. Maintenance of vertebral body height and alignment. Facets are well-aligned. Coronal reformats demonstrate a normal C1-C2 articulation.  IMPRESSION: 1. Right supraorbital and scalp soft tissue swelling without acute intracranial abnormality. 2. No acute fracture or subluxation within the cervical spine. 3. A left parotid gland nodule which is indeterminate. Of questionable clinical significance, given patient age.   Electronically Signed   By: Jeronimo Greaves M.D.   On: 01/02/2013 00:25   Ct Cervical Spine Wo Contrast  01/02/2013   CLINICAL DATA:  Fall at home.  Injury to right eye.  EXAM: CT HEAD WITHOUT CONTRAST  CT CERVICAL SPINE WITHOUT CONTRAST  TECHNIQUE: Multidetector CT imaging of the head and cervical spine was performed following the standard protocol without intravenous contrast. Multiplanar CT image reconstructions of the cervical spine were also generated.  COMPARISON:  None.  FINDINGS: CT HEAD FINDINGS  Sinuses/Soft tissues: Right supraorbital soft tissue injury. Normal appearance of the orbits and globes, without retrobulbar hemorrhage. There is also soft tissue swelling about the posterior vertex on image 56/series 5 and possible soft tissue swelling about the high left frontal scalp on image 54/series 4.  No underlying orbital fracture. Hyperostosis frontalis interna. No skull fracture. Clear mastoid air cells. Mucosal thickening of ethmoid air cells.  Intracranial: Expected cerebral atrophy. No mass lesion, hemorrhage, hydrocephalus, acute infarct, intra-axial, or extra-axial fluid collection.  CT CERVICAL SPINE FINDINGS  Spinal visualization through the bottom of T3. Prevertebral soft tissues are within normal limits. No apical pneumothorax.  Nodule in the left parotid gland which measures 1.3 cm on image 42/series 7  Cervical spondylosis with areas of bilateral neural foraminal narrowing, primarily at C5-6.  Skull base intact. Maintenance of vertebral body height and  alignment. Facets are well-aligned. Coronal reformats demonstrate a normal C1-C2 articulation.  IMPRESSION: 1. Right supraorbital and scalp soft tissue swelling without acute intracranial abnormality. 2. No acute fracture or subluxation within the cervical spine. 3. A left parotid gland nodule which is indeterminate. Of questionable clinical significance, given patient age.   Electronically Signed   By: Jeronimo Greaves M.D.   On: 01/02/2013 00:25   Dg Chest Portable 1 View  01/02/2013   CLINICAL DATA:  Fall with hip injury.  Leukocytosis.  EXAM: PORTABLE CHEST - 1 VIEW  COMPARISON:  None.  FINDINGS: Osteopenia.  Patient rotated to the right. Artifact over the upper chest. Mild cardiomegaly. No pleural effusion or pneumothorax. No lobar consolidation. Lower lobe and peripheral predominant interstitial opacities.  IMPRESSION:  Lower lobe and peripheral predominant interstitial opacities, suspicious for interstitial lung disease, likely pulmonary fibrosis.  No acute superimposed process.   Electronically Signed   By: Jeronimo Greaves M.D.   On: 01/02/2013 01:53   Dg Hip Portable 1 View Right  01/02/2013   CLINICAL DATA:  Postop right hip surgery  EXAM: PORTABLE RIGHT HIP - 1 VIEW  COMPARISON:  Portable exam 1729 hr compared to intraoperative images 12 04/2012  FINDINGS: Osseous demineralization.  IM nail with compression screw in proximal right femur across an intertrochanteric fracture.  Displaced lesser trochanteric fragment noted.  Again identified nondisplaced fracture at the right inferior pubic ramus.  Minimal narrowing of right hip joint.  Remainder of pelvis appears intact.  IMPRESSION: Post ORIF of a displaced intertrochanteric fracture of the right femur.  Nondisplaced fracture inferior right pubic ramus.   Electronically Signed   By: Ulyses Southward M.D.   On: 01/02/2013 18:07   Dg Femur Right Port  01/02/2013   CLINICAL DATA:  Lateral view of the femoral shaft  EXAM: PORTABLE RIGHT FEMUR - 2 VIEW.  The patient  refused the AP view  COMPARISON:  None.  FINDINGS: There is a fracture of the intertrochanteric -sub trochanteric region of the right femur. Avulsion of the lesser trochanter is demonstrated. The femoral head and neck cannot be adequately assessed on this single film. The femoral diaphysis appears intact where visualized.  IMPRESSION: This is a very limited single view exam. At the superior margin of the study partially imaged is a comminuted angulated intertrochanteric-sub trochanteric fracture of the proximal right femur with avulsion of the lesser trochanter.   Electronically Signed   By: David  [place]   On: 01/02/2013 08:07   Dg Knee Right Port  01/02/2013   CLINICAL DATA:  Recent fall with hip fracture  EXAM: PORTABLE RIGHT KNEE - 1-2 VIEW  COMPARISON:  None.  FINDINGS: There is degenerative joint disease of the right knee primarily involving the medial compartment where there is loss of joint space and sclerosis. No acute fracture is seen. No joint effusion is noted.  IMPRESSION: Degenerative change primarily involving the medial compartment. No acute fracture.   Electronically Signed   By: Dwyane Dee M.D.   On: 01/02/2013 08:16    Microbiology: Recent Results (from the past 240 hour(s))  CULTURE, BLOOD (ROUTINE X 2)     Status: None   Collection Time    01/02/13  1:34 AM      Result Value Range Status   Specimen Description BLOOD RIGHT ARM   Final   Special Requests BOTTLES DRAWN AEROBIC AND ANAEROBIC 10CC EACH   Final   Culture  Setup Time     Final   Value: 01/02/2013 08:08     Performed at [facility]   Culture     Final   Value:        BLOOD CULTURE RECEIVED NO GROWTH TO DATE CULTURE WILL BE HELD FOR 5 DAYS BEFORE ISSUING A FINAL NEGATIVE REPORT     Performed at [facility]   Report Status PENDING   Incomplete  CULTURE, BLOOD (ROUTINE X 2)     Status: None   Collection Time    01/02/13  1:40 AM      Result Value Range Status   Specimen Description BLOOD RIGHT  HAND   Final   Special Requests BOTTLES DRAWN AEROBIC ONLY 2CC   Final   Culture  Setup Time     Final  Value: 01/02/2013 08:08     Performed at [facility]   Culture     Final   Value:        BLOOD CULTURE RECEIVED NO GROWTH TO DATE CULTURE WILL BE HELD FOR 5 DAYS BEFORE ISSUING A FINAL NEGATIVE REPORT     Performed at [facility]   Report Status PENDING   Incomplete  URINE CULTURE     Status: None   Collection Time    01/02/13  3:27 AM      Result Value Range Status   Specimen Description URINE, RANDOM   Final   Special Requests NONE   Final   Culture  Setup Time     Final   Value: 01/02/2013 05:03     Performed at [facility] Count     Final   Value: NO GROWTH     Performed at [facility]   Culture     Final   Value: NO GROWTH     Performed at [facility]   Report Status 01/03/2013 FINAL   Final  SURGICAL PCR SCREEN     Status: None   Collection Time    01/02/13 10:17 AM      Result Value Range Status   MRSA, PCR NEGATIVE  NEGATIVE Final   Staphylococcus aureus NEGATIVE  NEGATIVE Final   Comment:            The Xpert SA Assay (FDA     approved for NASAL specimens     in patients over 6 years of age),     is one component of     a comprehensive surveillance     program.  Test performance has     been validated by [facility] for patients greater     than or equal to 38 year old.     It is not intended     to diagnose infection nor to     guide or monitor treatment.     Labs: Basic Metabolic Panel:  Recent Labs Lab 01/01/13 2305 01/02/13 0354 01/03/13 0549 01/04/13 0415  NA 138 140 141 142  K 3.9 3.8 3.6 3.5  CL 104 107 111 111  CO2 21 19 23 22   GLUCOSE 231* 208* 177* 136*  BUN 13 12 13 17   CREATININE 0.65 0.59 0.68 0.71  CALCIUM 8.8 8.5 7.7* 7.4*   Liver Function Tests:  Recent Labs Lab 01/02/13 0354  AST 36  ALT 17  ALKPHOS 86  BILITOT 0.9  PROT 6.6  ALBUMIN 2.9*   No results  found for this basename: LIPASE, AMYLASE,  in the last 168 hours No results found for this basename: AMMONIA,  in the last 168 hours CBC:  Recent Labs Lab 01/01/13 2305 01/03/13 0549 01/04/13 0415 01/05/13 0230 01/06/13 0555  WBC 24.9* 12.4* 11.6* 9.7 11.0*  NEUTROABS 22.4*  --   --   --   --   HGB 12.4 9.0* 8.1* 7.4* 7.7*  HCT 36.5 26.6* 25.2* 22.7* 23.8*  MCV 84.3 85.3 87.8 87.6 85.3  PLT 232 159 167 172 233   Cardiac Enzymes:  Recent Labs Lab 01/01/13 2305 01/02/13 0354 01/03/13 0549  CKTOTAL 875* 744* 340*  TROPONINI  --  <0.30  --    BNP: BNP (last 3 results) No results found for this basename: PROBNP,  in the last 8760 hours CBG:  Recent Labs Lab 01/05/13 0643 01/05/13 1110  01/05/13 1630 01/05/13 2158 01/06/13 0633  GLUCAP 110* 168* 145* 130* 123*       Signed:  Jeric Slagel  [facility] 01/06/2013, 10:57 AM

## 2013-01-06 NOTE — Progress Notes (Signed)
CSW (Clinical Social Worker) prepared pt dc packet and placed with shadow chart. CSW arranged non-emergent ambulance transport. Pt, pt family, pt nurse, and facility informed. CSW signing off.  Rayette Mogg, LCSWA [phone]  

## 2013-01-08 LAB — CULTURE, BLOOD (ROUTINE X 2): Culture: NO GROWTH

## 2013-03-26 ENCOUNTER — Encounter (HOSPITAL_COMMUNITY): Payer: Self-pay | Admitting: *Deleted

## 2013-03-26 ENCOUNTER — Inpatient Hospital Stay (HOSPITAL_COMMUNITY)
Admission: EM | Admit: 2013-03-26 | Discharge: 2013-04-01 | DRG: 871 | Disposition: A | Discharge status: Hospice | Payer: Medicare Other | Attending: Internal Medicine | Admitting: Internal Medicine

## 2013-03-26 ENCOUNTER — Emergency Department (HOSPITAL_COMMUNITY): Payer: Medicare Other

## 2013-03-26 DIAGNOSIS — S329XXA Fracture of unspecified parts of lumbosacral spine and pelvis, initial encounter for closed fracture: Secondary | ICD-10-CM | POA: Diagnosis present

## 2013-03-26 DIAGNOSIS — E876 Hypokalemia: Secondary | ICD-10-CM | POA: Diagnosis not present

## 2013-03-26 DIAGNOSIS — R197 Diarrhea, unspecified: Secondary | ICD-10-CM | POA: Diagnosis present

## 2013-03-26 DIAGNOSIS — R627 Adult failure to thrive: Secondary | ICD-10-CM | POA: Diagnosis present

## 2013-03-26 DIAGNOSIS — L98499 Non-pressure chronic ulcer of skin of other sites with unspecified severity: Secondary | ICD-10-CM | POA: Diagnosis present

## 2013-03-26 DIAGNOSIS — A419 Sepsis, unspecified organism: Secondary | ICD-10-CM | POA: Diagnosis present

## 2013-03-26 DIAGNOSIS — Z515 Encounter for palliative care: Secondary | ICD-10-CM

## 2013-03-26 DIAGNOSIS — E43 Unspecified severe protein-calorie malnutrition: Secondary | ICD-10-CM | POA: Diagnosis present

## 2013-03-26 DIAGNOSIS — N289 Disorder of kidney and ureter, unspecified: Secondary | ICD-10-CM | POA: Diagnosis present

## 2013-03-26 DIAGNOSIS — I129 Hypertensive chronic kidney disease with stage 1 through stage 4 chronic kidney disease, or unspecified chronic kidney disease: Secondary | ICD-10-CM | POA: Diagnosis present

## 2013-03-26 DIAGNOSIS — I498 Other specified cardiac arrhythmias: Secondary | ICD-10-CM | POA: Diagnosis not present

## 2013-03-26 DIAGNOSIS — E872 Acidosis, unspecified: Secondary | ICD-10-CM | POA: Diagnosis not present

## 2013-03-26 DIAGNOSIS — E860 Dehydration: Secondary | ICD-10-CM

## 2013-03-26 DIAGNOSIS — N179 Acute kidney failure, unspecified: Secondary | ICD-10-CM | POA: Diagnosis present

## 2013-03-26 DIAGNOSIS — K922 Gastrointestinal hemorrhage, unspecified: Secondary | ICD-10-CM | POA: Diagnosis present

## 2013-03-26 DIAGNOSIS — E119 Type 2 diabetes mellitus without complications: Secondary | ICD-10-CM | POA: Diagnosis present

## 2013-03-26 DIAGNOSIS — N189 Chronic kidney disease, unspecified: Secondary | ICD-10-CM | POA: Diagnosis present

## 2013-03-26 DIAGNOSIS — A4102 Sepsis due to Methicillin resistant Staphylococcus aureus: Principal | ICD-10-CM | POA: Diagnosis present

## 2013-03-26 DIAGNOSIS — E870 Hyperosmolality and hypernatremia: Secondary | ICD-10-CM | POA: Diagnosis present

## 2013-03-26 DIAGNOSIS — Z66 Do not resuscitate: Secondary | ICD-10-CM | POA: Diagnosis present

## 2013-03-26 DIAGNOSIS — I959 Hypotension, unspecified: Secondary | ICD-10-CM

## 2013-03-26 DIAGNOSIS — R6520 Severe sepsis without septic shock: Secondary | ICD-10-CM

## 2013-03-26 DIAGNOSIS — E861 Hypovolemia: Secondary | ICD-10-CM | POA: Diagnosis present

## 2013-03-26 DIAGNOSIS — D649 Anemia, unspecified: Secondary | ICD-10-CM | POA: Diagnosis not present

## 2013-03-26 DIAGNOSIS — I214 Non-ST elevation (NSTEMI) myocardial infarction: Secondary | ICD-10-CM

## 2013-03-26 DIAGNOSIS — R7881 Bacteremia: Secondary | ICD-10-CM | POA: Diagnosis present

## 2013-03-26 DIAGNOSIS — D72829 Elevated white blood cell count, unspecified: Secondary | ICD-10-CM | POA: Diagnosis present

## 2013-03-26 DIAGNOSIS — Z85828 Personal history of other malignant neoplasm of skin: Secondary | ICD-10-CM

## 2013-03-26 DIAGNOSIS — K7200 Acute and subacute hepatic failure without coma: Secondary | ICD-10-CM

## 2013-03-26 LAB — CBC
HCT: 40.7 % (ref 36.0–46.0)
Hemoglobin: 13.2 g/dL (ref 12.0–15.0)
MCH: 28.3 pg (ref 26.0–34.0)
MCHC: 32.4 g/dL (ref 30.0–36.0)
MCV: 87.3 fL (ref 78.0–100.0)
PLATELETS: 217 10*3/uL (ref 150–400)
RBC: 4.66 MIL/uL (ref 3.87–5.11)
RDW: 20 % — AB (ref 11.5–15.5)
WBC: 30.2 10*3/uL — ABNORMAL HIGH (ref 4.0–10.5)

## 2013-03-26 LAB — CBC WITH DIFFERENTIAL/PLATELET
BASOS ABS: 0 10*3/uL (ref 0.0–0.1)
Basophils Relative: 0 % (ref 0–1)
EOS ABS: 0 10*3/uL (ref 0.0–0.7)
Eosinophils Relative: 0 % (ref 0–5)
HCT: 44.6 % (ref 36.0–46.0)
HEMOGLOBIN: 14.3 g/dL (ref 12.0–15.0)
Lymphocytes Relative: 5 % — ABNORMAL LOW (ref 12–46)
Lymphs Abs: 1.6 10*3/uL (ref 0.7–4.0)
MCH: 28.7 pg (ref 26.0–34.0)
MCHC: 32.1 g/dL (ref 30.0–36.0)
MCV: 89.4 fL (ref 78.0–100.0)
MONO ABS: 1.3 10*3/uL — AB (ref 0.1–1.0)
Monocytes Relative: 4 % (ref 3–12)
NEUTROS ABS: 28.8 10*3/uL — AB (ref 1.7–7.7)
NEUTROS PCT: 91 % — AB (ref 43–77)
Platelets: 201 10*3/uL (ref 150–400)
RBC: 4.99 MIL/uL (ref 3.87–5.11)
RDW: 19.9 % — AB (ref 11.5–15.5)
WBC: 31.7 10*3/uL — ABNORMAL HIGH (ref 4.0–10.5)

## 2013-03-26 LAB — COMPREHENSIVE METABOLIC PANEL
ALT: 508 U/L — ABNORMAL HIGH (ref 0–35)
AST: 730 U/L — ABNORMAL HIGH (ref 0–37)
Albumin: 2 g/dL — ABNORMAL LOW (ref 3.5–5.2)
Alkaline Phosphatase: 147 U/L — ABNORMAL HIGH (ref 39–117)
BUN: 60 mg/dL — AB (ref 6–23)
CALCIUM: 7.9 mg/dL — AB (ref 8.4–10.5)
CO2: 17 mEq/L — ABNORMAL LOW (ref 19–32)
Chloride: 115 mEq/L — ABNORMAL HIGH (ref 96–112)
Creatinine, Ser: 1.95 mg/dL — ABNORMAL HIGH (ref 0.50–1.10)
GFR calc non Af Amer: 22 mL/min — ABNORMAL LOW (ref >90)
GFR, EST AFRICAN AMERICAN: 25 mL/min — AB (ref >90)
Glucose, Bld: 233 mg/dL — ABNORMAL HIGH (ref 70–99)
Potassium: 3.9 mEq/L (ref 3.7–5.3)
Sodium: 156 mEq/L — ABNORMAL HIGH (ref 137–147)
TOTAL PROTEIN: 5.4 g/dL — AB (ref 6.0–8.3)
Total Bilirubin: 0.5 mg/dL (ref 0.3–1.2)

## 2013-03-26 LAB — URINE MICROSCOPIC-ADD ON

## 2013-03-26 LAB — POC OCCULT BLOOD, ED: FECAL OCCULT BLD: POSITIVE — AB

## 2013-03-26 LAB — I-STAT VENOUS BLOOD GAS, ED
Acid-base deficit: 14 mmol/L — ABNORMAL HIGH (ref 0.0–2.0)
Bicarbonate: 13.4 mEq/L — ABNORMAL LOW (ref 20.0–24.0)
O2 Saturation: 64 %
PH VEN: 7.202 — AB (ref 7.250–7.300)
TCO2: 14 mmol/L (ref 0–100)
pCO2, Ven: 34.1 mmHg — ABNORMAL LOW (ref 45.0–50.0)
pO2, Ven: 40 mmHg (ref 30.0–45.0)

## 2013-03-26 LAB — URINALYSIS, ROUTINE W REFLEX MICROSCOPIC
Glucose, UA: 100 mg/dL — AB
Hgb urine dipstick: NEGATIVE
Ketones, ur: 15 mg/dL — AB
NITRITE: NEGATIVE
PH: 5 (ref 5.0–8.0)
Protein, ur: NEGATIVE mg/dL
SPECIFIC GRAVITY, URINE: 1.024 (ref 1.005–1.030)
Urobilinogen, UA: 0.2 mg/dL (ref 0.0–1.0)

## 2013-03-26 LAB — TYPE AND SCREEN
ABO/RH(D): O POS
Antibody Screen: NEGATIVE

## 2013-03-26 LAB — TROPONIN I: Troponin I: 4.37 ng/mL (ref <0.30)

## 2013-03-26 LAB — GLUCOSE, CAPILLARY: GLUCOSE-CAPILLARY: 201 mg/dL — AB (ref 70–99)

## 2013-03-26 LAB — CLOSTRIDIUM DIFFICILE BY PCR: Toxigenic C. Difficile by PCR: NEGATIVE

## 2013-03-26 LAB — MRSA PCR SCREENING: MRSA by PCR: POSITIVE — AB

## 2013-03-26 LAB — I-STAT CG4 LACTIC ACID, ED: LACTIC ACID, VENOUS: 14.25 mmol/L — AB (ref 0.5–2.2)

## 2013-03-26 MED ORDER — PANTOPRAZOLE SODIUM 40 MG IV SOLR
40.0000 mg | Freq: Two times a day (BID) | INTRAVENOUS | Status: DC
Start: 2013-03-26 — End: 2013-04-01
  Administered 2013-03-26 – 2013-04-01 (×12): 40 mg via INTRAVENOUS
  Filled 2013-03-26 (×14): qty 40

## 2013-03-26 MED ORDER — BIOTENE DRY MOUTH MT LIQD
15.0000 mL | Freq: Two times a day (BID) | OROMUCOSAL | Status: DC
  Administered 2013-03-26 – 2013-04-01 (×12): 15 mL via OROMUCOSAL

## 2013-03-26 MED ORDER — VANCOMYCIN HCL IN DEXTROSE 750-5 MG/150ML-% IV SOLN
750.0000 mg | INTRAVENOUS | Status: DC
  Filled 2013-03-26: qty 150

## 2013-03-26 MED ORDER — PIPERACILLIN-TAZOBACTAM 3.375 G IVPB
3.3750 g | Freq: Three times a day (TID) | INTRAVENOUS | Status: DC
  Administered 2013-03-26: 3.375 g via INTRAVENOUS
  Filled 2013-03-26 (×4): qty 50

## 2013-03-26 MED ORDER — SODIUM CHLORIDE 0.9 % IV BOLUS (SEPSIS)
1000.0000 mL | Freq: Once | INTRAVENOUS | Status: AC
  Administered 2013-03-26: 1000 mL via INTRAVENOUS

## 2013-03-26 MED ORDER — SODIUM CHLORIDE 0.9 % IV BOLUS (SEPSIS): 1000.0000 mL | Freq: Once | INTRAVENOUS | Status: DC

## 2013-03-26 MED ORDER — ONDANSETRON HCL 4 MG/2ML IJ SOLN
4.0000 mg | Freq: Once | INTRAMUSCULAR | Status: AC
  Administered 2013-03-26: 4 mg via INTRAVENOUS
  Filled 2013-03-26: qty 2

## 2013-03-26 MED ORDER — PIPERACILLIN-TAZOBACTAM 3.375 G IVPB
3.3750 g | Freq: Once | INTRAVENOUS | Status: AC
  Administered 2013-03-26: 3.375 g via INTRAVENOUS
  Filled 2013-03-26: qty 50

## 2013-03-26 MED ORDER — VANCOMYCIN HCL IN DEXTROSE 1-5 GM/200ML-% IV SOLN
1000.0000 mg | Freq: Once | INTRAVENOUS | Status: AC
  Administered 2013-03-26: 1000 mg via INTRAVENOUS
  Filled 2013-03-26: qty 200

## 2013-03-26 MED ORDER — SODIUM CHLORIDE 0.9 % IJ SOLN
3.0000 mL | Freq: Two times a day (BID) | INTRAMUSCULAR | Status: DC
  Administered 2013-03-26 – 2013-04-01 (×5): 3 mL via INTRAVENOUS

## 2013-03-26 MED ORDER — SODIUM CHLORIDE 0.9 % IV SOLN
INTRAVENOUS | Status: DC
  Administered 2013-03-26 (×2): via INTRAVENOUS

## 2013-03-26 MED ORDER — INSULIN ASPART 100 UNIT/ML ~~LOC~~ SOLN
0.0000 [IU] | SUBCUTANEOUS | Status: DC
  Administered 2013-03-26: 2 [IU] via SUBCUTANEOUS
  Administered 2013-03-27 – 2013-03-30 (×6): 1 [IU] via SUBCUTANEOUS

## 2013-03-26 NOTE — H&P (Signed)
Date: 03/26/2013               Patient Name:  Kaitlin Brennan MRN: [id number]  DOB: [birth year] Age / Sex: 80 y.o., female   PCP: No Pcp Per Patient         Medical Service: Internal Medicine Teaching Service         Attending Physician: Dr. Rocco Serene, MD    First Contact: Dr. Evelena Peat Pager: [phone]  Second Contact: Dr. Charlsie Merles Pager: [phone]       After Hours (After 5p/  First Contact Pager: [phone]  weekends / holidays): Second Contact Pager: [phone]   Chief Complaint: altered mental status  History of Present Illness: Kaitlin Brennan is a 80 year old with a PMH of DM type 2, basal cell carcinoma, right intertrochanteric femur fracture (s/p repair Dec 2014).  The patient presents from SNF with AMS.  When EMS arrived her BP was 60/40. Son provides most of the history.  She was independent up until hip fracture 3 months ago.  After surgery she was discharged to SNF where she has lived since.  Her son reports periods of confusion, decreased po and N/V for the past month.  He visit with her daily and notes she has good and bad days.  He also notes that she has been treated with antibiotics for frequent UTIs recently.  She was most recently treated 2 weeks ago.  He reports increasing lethargy in past few days.  She has not had diarrhea as far as he knows.    In the ED:  T 98.58F, RR 18-33, SpO2 100% on 2L, HR 100-113, BP 74/56; she received 2L NSS, Vancomycin, Zosyn and Zofran.  She had episode of dark emesis and bloody diarrhea in the ED.  Meds:  Outpatient meds:  Oscal, Vitamin B12, Fe, lactobacillus acidophilus, Ativan, MgOx, metformin, Remeron, nystatin cream, Phenergan, senna, Vit D.  Current Facility-Administered Medications  Medication Dose Route Frequency Provider Last Rate Last Dose  . 0.9 %  sodium chloride infusion   Intravenous Continuous Toy Baker, MD 125 mL/hr at 03/26/13 1954    . antiseptic oral rinse (BIOTENE) solution 15 mL  15 mL Mouth Rinse BID  Rocco Serene, MD   15 mL at 03/26/13 2015  . insulin aspart (novoLOG) injection 0-9 Units  0-9 Units Subcutaneous 6 times per day Genelle Gather, MD   2 Units at 03/26/13 1954  . pantoprazole (PROTONIX) injection 40 mg  40 mg Intravenous Q12H Genelle Gather, MD      . piperacillin-tazobactam (ZOSYN) IVPB 3.375 g  3.375 g Intravenous 3 times per day Herby Abraham, RPH      . sodium chloride 0.9 % bolus 1,000 mL  1,000 mL Intravenous Once Genelle Gather, MD      . sodium chloride 0.9 % injection 3 mL  3 mL Intravenous Q12H Genelle Gather, MD      . Melene Muller ON 03/27/2013] vancomycin (VANCOCIN) IVPB 750 mg/150 ml premix  750 mg Intravenous Q24H Herby Abraham, RPH        Allergies: Allergies as of 03/26/2013  . (No Known Allergies)   Past Medical History  Diagnosis Date  . Diabetes mellitus without complication   . Hypertension    Past Surgical History  Procedure Laterality Date  . Intramedullary (im) nail intertrochanteric Right 01/02/2013    Procedure: INTRAMEDULLARY (IM) NAIL INTERTROCHANTRIC RIGHT HIP;  Surgeon: Cheral Almas, MD;  Location: [facility] OR;  Service: Orthopedics;  Laterality: Right;   Family History  Problem Relation Age of Onset  . Lung cancer Father    History   Social History  . Marital Status: Single    Spouse Name: N/A    Number of Children: N/A  . Years of Education: N/A   Occupational History  . Not on file.   Social History Main Topics  . Smoking status: Never Smoker   . Smokeless tobacco: Not on file  . Alcohol Use: No  . Drug Use: No  . Sexual Activity: No   Other Topics Concern  . Not on file   Social History Narrative  . No narrative on file    Review of Systems: Pertinent items noted in HPI.  Physical Exam: Blood pressure 108/51, pulse 100, temperature 97.5 F (36.4 C), temperature source Axillary, resp. rate 22, height 5' (1.524 m), weight 60.5 kg (133 lb 6.1 oz), SpO2 100.00%. General: resting in bed, NAD HEENT: PERRL,  EOMI Cardiac: RRR, no rubs, murmurs or gallops; hands and feet cool to touch Pulm: clear to auscultation bilaterally, moving normal volumes of air Abd: soft, nondistended, BS present, + mild diffuse tenderness Ext: warm and well perfused, no pedal edema Skin:  Several crusted lesions (basal cell CA history) on right neck, face and legs Neuro: alert and oriented X3, cranial nerves II-XII grossly intact, following commands, opens eyes to voice, some verbal responses but otherwise nodding yes or no  Lab results: Basic Metabolic Panel:  Recent Labs  16/10/9600/25/15 1725  NA 156*  K 3.9  CL 115*  CO2 17*  GLUCOSE 233*  BUN 60*  CREATININE 1.95*  CALCIUM 7.9*   Liver Function Tests:  Recent Labs  03/26/13 1725  AST 730*  ALT 508*  ALKPHOS 147*  BILITOT 0.5  PROT 5.4*  ALBUMIN 2.0*   CBC:  Recent Labs  03/26/13 1200  WBC 31.7*  NEUTROABS 28.8*  HGB 14.3  HCT 44.6  MCV 89.4  PLT 201   Cardiac Enzymes:  Recent Labs  03/26/13 1725  TROPONINI 4.37*   CBG:  Recent Labs  03/26/13 1944  GLUCAP 201*   Urinalysis:  Recent Labs  03/26/13 1225  COLORURINE AMBER*  LABSPEC 1.024  PHURINE 5.0  GLUCOSEU 100*  HGBUR NEGATIVE  BILIRUBINUR MODERATE*  KETONESUR 15*  PROTEINUR NEGATIVE  UROBILINOGEN 0.2  NITRITE NEGATIVE  LEUKOCYTESUR SMALL*    Imaging results:  Dg Chest Port 1 View  03/26/2013   CLINICAL DATA:  Shortness of breath and weakness  EXAM: PORTABLE CHEST - 1 VIEW  COMPARISON:  01/02/2013  FINDINGS: Fine and coarse interstitial opacities diffusely. Lung volumes appear decreased. No cardiomegaly. Limited assessment of the hila and upper mediastinal contours due to rightward rotation. No evidence of acute superimposed disease, including edema or consolidation. No effusion or pneumothorax.  IMPRESSION: Interstitial lung disease without evidence of acute superimposed process.   Electronically Signed   By: Tiburcio PeaJonathan  Watts M.D.   On: 03/26/2013 13:27      Assessment & Plan by Problem: 80 year old woman with DM type 2, basal cell carcinoma, right intertrochanteric femur fracture (s/p repair Dec 2014) who presents with AMS and hypotension.  Severe sepsis:  Tachycardia, tachypneic, WBC 31.7, Evidence of hypoperfusion:  Hypotension, lactic acid 14.25, creatinine, liver enzymes and troponin elevated.  Potential sources include urine (UA positive for leukocytes) vs GI (vomiting and diarrhea).  Hypotension responding to IV fluids. - admit to SDU (pt is DNI/DNR so will not send to ICU) -  IV fluids - blood cultures x 2, urine cultures - Vancomycin and Zosyn per pharmacy - Zofran - stool pathogen panel, c diff toxin - repeat CBC stat and q6 x 2, repeat CMP now then q6, CE x 3  GI bleed:  FOBT + and two episodes of diarrhea in the ED (one with gross blood per nurse).  Dark (but not coffee ground) emesis  -  IV protonix - GI consulted - stool pathogen panel pending  Hypernatremia:  Na 156 likely 2/2 to dehydration.  Son reports poor po for past month.  - hydrating with boluses followed by maintenance   Positive troponin:   Trop 4.37.  Likely 2/2 to demand ischemia.  Spoke with cardiology and given GI bleed not a candidate for heparin or even ASA.    DM type 2:  Hgb A1c 7.04 Jan 2013.  Home medication is metformin.   - SSI sensitive  VTE ppx:  SCDs only in the setting of GI bleed  Dispo: Disposition is deferred at this time, awaiting improvement of current medical problems.  Anticipated discharge in approximately 1-2 day(s).   The patient does not have a current PCP (No Pcp Per Patient) and does not need an [facility] hospital follow-up appointment after discharge.  The patient does not know have transportation limitations that hinder transportation to clinic appointments.  Signed: Evelena Peat, DO 03/26/2013, 8:34 PM

## 2013-03-26 NOTE — ED Notes (Signed)
Md Freida BusmanAllen at bedside discussing plan of care with son, Melven Sartoriusnthony Kraynak.

## 2013-03-26 NOTE — ED Provider Notes (Signed)
CSN: [id number]     Arrival date & time 03/26/13  1151 History   First MD Initiated Contact with Patient 03/26/13 1152     Chief Complaint  Patient presents with  . Altered Mental Status     (Consider location/radiation/quality/duration/timing/severity/associated sxs/prior Treatment) Patient is a 80 y.o. female presenting with altered mental status. The history is provided by the EMS personnel. The history is limited by the condition of the patient.  Altered Mental Status  patient here from nursing home with altered mental status. Symptoms started approximate one hour prior to arrival. Patient does have a history of dementia but her current status is different than her baseline. EMS arrived and she was noted to be hypotensive with a blood pressure of 60/40. Your CBG is 268 increased respirations. Patient has a DO NOT RESUSCITATE. She was given IV fluids and transported here. No further history obtainable.  Past Medical History  Diagnosis Date  . Diabetes mellitus without complication   . Hypertension    Past Surgical History  Procedure Laterality Date  . Intramedullary (im) nail intertrochanteric Right 01/02/2013    Procedure: INTRAMEDULLARY (IM) NAIL INTERTROCHANTRIC RIGHT HIP;  Surgeon: Cheral AlmasNaiping Michael Xu, MD;  Location: [facility] OR;  Service: Orthopedics;  Laterality: Right;   Family History  Problem Relation Age of Onset  . Lung cancer Father    History  Substance Use Topics  . Smoking status: Never Smoker   . Smokeless tobacco: Not on file  . Alcohol Use: No   OB History   Grav Para Term Preterm Abortions TAB SAB Ect Mult Living                 Review of Systems  Unable to perform ROS     Allergies  Review of patient's allergies indicates no known allergies.  Home Medications   Current Outpatient Rx  Name  Route  Sig  Dispense  Refill  . aspirin EC 325 MG tablet   Oral   Take 1 tablet (325 mg total) by mouth 2 (two) times daily.   84 tablet   0   .  calcium-vitamin D (OSCAL WITH D) 500-200 MG-UNIT per tablet   Oral   Take 1 tablet by mouth daily with breakfast.   30 tablet   0   . HYDROcodone-acetaminophen (NORCO) 5-325 MG per tablet   Oral   Take 1-2 tablets by mouth every 6 (six) hours as needed.   90 tablet   0   . metFORMIN (GLUCOPHAGE) 500 MG tablet   Oral   Take 1 tablet (500 mg total) by mouth 2 (two) times daily with a meal.   30 tablet   0   . methocarbamol (ROBAXIN) 500 MG tablet   Oral   Take 1 tablet (500 mg total) by mouth every 6 (six) hours as needed for muscle spasms.   30 tablet   0   . senna (SENOKOT) 8.6 MG TABS tablet   Oral   Take 1 tablet (8.6 mg total) by mouth 2 (two) times daily.   120 each   0   . Vitamin D, Ergocalciferol, (DRISDOL) 50000 UNITS CAPS capsule   Oral   Take 1 capsule (50,000 Units total) by mouth every 7 (seven) days.   10 capsule   0    BP 74/56  Pulse 107  SpO2 100% Physical Exam  Nursing note and vitals reviewed. Constitutional: She appears lethargic. She appears cachectic. She appears toxic. She has a sickly appearance. She appears  ill. She appears distressed.    HENT:  Head: Normocephalic and atraumatic.  Eyes: Conjunctivae, EOM and lids are normal. Pupils are equal, round, and reactive to light.  Neck: Normal range of motion. Neck supple. No tracheal deviation present. No mass present.  Cardiovascular: Normal heart sounds.  Tachycardia present.  Exam reveals no gallop.   No murmur heard. Pulmonary/Chest: Accessory muscle usage present. No stridor. Tachypnea noted. She is in respiratory distress. She has decreased breath sounds.  Abdominal: Soft. Normal appearance and bowel sounds are normal. She exhibits no distension. There is no tenderness. There is no rebound and no CVA tenderness.  Musculoskeletal: Normal range of motion. She exhibits no edema and no tenderness.  Neurological: She has normal strength. She appears lethargic. No cranial nerve deficit or  sensory deficit. GCS eye subscore is 4. GCS verbal subscore is 5. GCS motor subscore is 6.  Skin: Skin is warm and dry. No abrasion and no rash noted.  Psychiatric: She is noncommunicative. She is inattentive.    ED Course  Procedures (including critical care time) Labs Review Labs Reviewed  URINE CULTURE  CULTURE, BLOOD (ROUTINE X 2)  CULTURE, BLOOD (ROUTINE X 2)  TROPONIN I  CBC WITH DIFFERENTIAL  COMPREHENSIVE METABOLIC PANEL  URINALYSIS, ROUTINE W REFLEX MICROSCOPIC  BLOOD GAS, ARTERIAL  I-STAT CG4 LACTIC ACID, ED   Imaging Review No results found.  EKG Interpretation   None       MDM   Final diagnoses:  None    Patient given IV fluids here for her hypotension which she responded well to after 2 L of saline. Mental status did improve. We'll start her on empiric antibiotic coverage with vancomycin and Zosyn. Spoke with patient's son at length about patient's CODE STATUS and he is agreeable to keep it has a DO NOT RESUSCITATE.   CRITICAL CARE Performed by: Toy Baker Total critical care time: 60 Critical care time was exclusive of separately billable procedures and treating other patients. Critical care was necessary to treat or prevent imminent or life-threatening deterioration. Critical care was time spent personally by me on the following activities: development of treatment plan with patient and/or surrogate as well as nursing, discussions with consultants, evaluation of patient's response to treatment, examination of patient, obtaining history from patient or surrogate, ordering and performing treatments and interventions, ordering and review of laboratory studies, ordering and review of radiographic studies, pulse oximetry and re-evaluation of patient's condition.     Toy Baker, MD 03/26/13 [phone]

## 2013-03-26 NOTE — Progress Notes (Signed)
ANTIBIOTIC CONSULT NOTE - INITIAL  Pharmacy Consult for vancomycin and zosyn Indication: sepsis  No Known Allergies  Patient Measurements:   Weight: 76 kg on 01/02/13  Vital Signs: Temp: 98.1 F (36.7 C) (02/25 1500) Temp src: Core (Comment) (02/25 1403) BP: 100/72 mmHg (02/25 1659) Pulse Rate: 100 (02/25 1659) Intake/Output from previous day:   Intake/Output from this shift: Total I/O In: 2000 [I.V.:2000] Out: 475 [Urine:475]  Labs:  Recent Labs  03/26/13 1200  WBC 31.7*  HGB 14.3  PLT 201   The CrCl is unknown because both a height and weight (above a minimum accepted value) are required for this calculation. No results found for this basename: VANCOTROUGH, VANCOPEAK, VANCORANDOM, GENTTROUGH, GENTPEAK, GENTRANDOM, TOBRATROUGH, TOBRAPEAK, TOBRARND, AMIKACINPEAK, AMIKACINTROU, AMIKACIN,  in the last 72 hours   Microbiology: No results found for this or any previous visit (from the past 720 hour(s)).  Medical History: Past Medical History  Diagnosis Date  . Diabetes mellitus without complication   . Hypertension     Medications:  No abx per SNF records  Assessment: 80 yo F to start vancomycin and zosyn per pharmacy for sepsis.  WBC elevated at 31.7, temp 98.1. Vancomycin 1000 mg and zosyn 3.375 gm given in the ED at ~ 1400 today.  Creat 1.95, creat cl ~ 23 ml/min.   Wt 76 kg on 01/02/13.  Lactic acid elevated at 14.25.  CXR: interstitial lung disease with no evidence of acute superimposed process.  She presented with altered mental status different from her baseline.  [facility] 60[facility]  Pt having diarrhea and emesis in ED.  Stool heme positive.   Vancomycin 2/25>> Zosyn 2/25>>  2/25 c diff neg 2/25 Ucx 2/25 Bcx x 2  Goal of Therapy:  Vancomycin trough level 15-20 mcg/ml  Plan:  1. Vancomycin 1000 mg given in ED at 1400, then vancomycin 750 mg IV q24hr 2. Zosyn 3.375 gm givn in ED at 1400, then zosyn 3.375 gm IV q8h, infuse each dose over 4  hours 3. F/u renal function, wbc, temp, culture data  Herby AbrahamMichelle T. Moet Mikulski, Pharm.D. [phone] 03/26/2013 6:41 PM

## 2013-03-26 NOTE — ED Notes (Signed)
Admitting MD Kilma at bedside. Repeat EKG captured.

## 2013-03-26 NOTE — ED Notes (Signed)
Noted that CMP and Troponin have not resulted. Called lab who states that they never received appropriate tube to collect. Phlebotomy at bedside drawing correct tube. Md made aware of delay. Pt resting comfortably in bed.

## 2013-03-26 NOTE — ED Notes (Signed)
Pt undressed, in gown, on monitor, continuous pulse oximetry, blood pressure cuff and oxygen [place] (2L) 

## 2013-03-26 NOTE — ED Notes (Signed)
Pt physically moved from Trauma C to room D36; pt placed back on monitor, continuous pulse oximetry, blood pressure cuff and oxygen [place] (2L)

## 2013-03-26 NOTE — ED Notes (Signed)
NOTIFIED DR. ALLEN IN PERSON OF PATIENTS PANIC LAB RESULTS OF CG4+ LACTIC ACIS @12 :38 PM ,03/26/2013.

## 2013-03-26 NOTE — ED Notes (Signed)
[facility] was at baseline this AM, but noticed changed in mental status at 1100 today. On EMS arrival, pt confused, nonverbal, moving with purposeful movent. BP 60/40. ST 110 w/ PAC. CBG 268.  RR 40. Wound noted to left arm and behind left ear.

## 2013-03-26 NOTE — ED Notes (Signed)
Admitting MD at bedside.

## 2013-03-26 NOTE — ED Notes (Signed)
Attempted to call report x 1  

## 2013-03-26 NOTE — Progress Notes (Signed)
Pt came here from the ED 15 minutes ago. Pt is alert but not oriented. Pt's son here and gave contact information to reach him on cell: [phone] or home at [phone] for any reason. Pt placed in bed. Bed alarm on. Elink notified. Central telemetry notified. Giving report to night nurse. Did MRSA swab and sent to lab.

## 2013-03-26 NOTE — ED Notes (Signed)
Pt has episode of diarrhea and emesis. Admitting MD at bedside. Pt cleaned; new linens place. Pt denies nausea or pain at this time. Family at bedside. VSS. Samples to lab.

## 2013-03-26 NOTE — ED Notes (Signed)
PT has second large loose bowel movement with blood like tinge. Pt cleaned again and placed in clean linens.

## 2013-03-27 DIAGNOSIS — E870 Hyperosmolality and hypernatremia: Secondary | ICD-10-CM

## 2013-03-27 DIAGNOSIS — E119 Type 2 diabetes mellitus without complications: Secondary | ICD-10-CM

## 2013-03-27 DIAGNOSIS — A419 Sepsis, unspecified organism: Secondary | ICD-10-CM

## 2013-03-27 DIAGNOSIS — K922 Gastrointestinal hemorrhage, unspecified: Secondary | ICD-10-CM

## 2013-03-27 DIAGNOSIS — E43 Unspecified severe protein-calorie malnutrition: Secondary | ICD-10-CM | POA: Diagnosis present

## 2013-03-27 LAB — COMPREHENSIVE METABOLIC PANEL
ALBUMIN: 1.8 g/dL — AB (ref 3.5–5.2)
ALK PHOS: 142 U/L — AB (ref 39–117)
ALT: 432 U/L — ABNORMAL HIGH (ref 0–35)
ALT: 376 U/L — AB (ref 0–35)
ALT: 288 U/L — ABNORMAL HIGH (ref 0–35)
AST: 449 U/L — ABNORMAL HIGH (ref 0–37)
AST: 321 U/L — ABNORMAL HIGH (ref 0–37)
AST: 177 U/L — AB (ref 0–37)
Albumin: 1.7 g/dL — ABNORMAL LOW (ref 3.5–5.2)
Albumin: 1.7 g/dL — ABNORMAL LOW (ref 3.5–5.2)
Alkaline Phosphatase: 122 U/L — ABNORMAL HIGH (ref 39–117)
Alkaline Phosphatase: 119 U/L — ABNORMAL HIGH (ref 39–117)
BUN: 67 mg/dL — ABNORMAL HIGH (ref 6–23)
BUN: 67 mg/dL — ABNORMAL HIGH (ref 6–23)
BUN: 58 mg/dL — ABNORMAL HIGH (ref 6–23)
CALCIUM: 7.9 mg/dL — AB (ref 8.4–10.5)
CHLORIDE: 122 meq/L — AB (ref 96–112)
CO2: 20 mEq/L (ref 19–32)
CO2: 17 meq/L — AB (ref 19–32)
CO2: 17 meq/L — AB (ref 19–32)
Calcium: 7.8 mg/dL — ABNORMAL LOW (ref 8.4–10.5)
Calcium: 8 mg/dL — ABNORMAL LOW (ref 8.4–10.5)
Chloride: 119 mEq/L — ABNORMAL HIGH (ref 96–112)
Chloride: 119 mEq/L — ABNORMAL HIGH (ref 96–112)
Creatinine, Ser: 2.08 mg/dL — ABNORMAL HIGH (ref 0.50–1.10)
Creatinine, Ser: 2.01 mg/dL — ABNORMAL HIGH (ref 0.50–1.10)
Creatinine, Ser: 1.79 mg/dL — ABNORMAL HIGH (ref 0.50–1.10)
GFR calc Af Amer: 24 mL/min — ABNORMAL LOW (ref >90)
GFR calc Af Amer: 28 mL/min — ABNORMAL LOW (ref >90)
GFR calc non Af Amer: 20 mL/min — ABNORMAL LOW (ref >90)
GFR, EST AFRICAN AMERICAN: 23 mL/min — AB (ref >90)
GFR, EST NON AFRICAN AMERICAN: 21 mL/min — AB (ref >90)
GFR, EST NON AFRICAN AMERICAN: 24 mL/min — AB (ref >90)
GLUCOSE: 121 mg/dL — AB (ref 70–99)
GLUCOSE: 154 mg/dL — AB (ref 70–99)
Glucose, Bld: 121 mg/dL — ABNORMAL HIGH (ref 70–99)
POTASSIUM: 3.4 meq/L — AB (ref 3.7–5.3)
Potassium: 3.3 mEq/L — ABNORMAL LOW (ref 3.7–5.3)
Potassium: 4.5 mEq/L (ref 3.7–5.3)
SODIUM: 158 meq/L — AB (ref 137–147)
SODIUM: 153 meq/L — AB (ref 137–147)
Sodium: 159 mEq/L — ABNORMAL HIGH (ref 137–147)
TOTAL PROTEIN: 4.9 g/dL — AB (ref 6.0–8.3)
TOTAL PROTEIN: 5.1 g/dL — AB (ref 6.0–8.3)
Total Bilirubin: 0.4 mg/dL (ref 0.3–1.2)
Total Bilirubin: 0.4 mg/dL (ref 0.3–1.2)
Total Bilirubin: 0.4 mg/dL (ref 0.3–1.2)
Total Protein: 5 g/dL — ABNORMAL LOW (ref 6.0–8.3)

## 2013-03-27 LAB — CBC
HCT: 36.6 % (ref 36.0–46.0)
HCT: 38.5 % (ref 36.0–46.0)
HEMOGLOBIN: 12.3 g/dL (ref 12.0–15.0)
Hemoglobin: 12 g/dL (ref 12.0–15.0)
MCH: 28.2 pg (ref 26.0–34.0)
MCH: 27.7 pg (ref 26.0–34.0)
MCHC: 31.9 g/dL (ref 30.0–36.0)
MCHC: 32.8 g/dL (ref 30.0–36.0)
MCV: 86.1 fL (ref 78.0–100.0)
MCV: 86.7 fL (ref 78.0–100.0)
PLATELETS: 182 10*3/uL (ref 150–400)
PLATELETS: 200 10*3/uL (ref 150–400)
RBC: 4.25 MIL/uL (ref 3.87–5.11)
RBC: 4.44 MIL/uL (ref 3.87–5.11)
RDW: 19.8 % — ABNORMAL HIGH (ref 11.5–15.5)
RDW: 20 % — AB (ref 11.5–15.5)
WBC: 21.7 10*3/uL — AB (ref 4.0–10.5)
WBC: 24.4 10*3/uL — ABNORMAL HIGH (ref 4.0–10.5)

## 2013-03-27 LAB — URINE CULTURE
COLONY COUNT: NO GROWTH
Culture: NO GROWTH

## 2013-03-27 LAB — HEPATITIS PANEL, ACUTE
HCV Ab: NEGATIVE
HEP A IGM: NONREACTIVE
Hep B C IgM: NONREACTIVE
Hepatitis B Surface Ag: NEGATIVE

## 2013-03-27 LAB — TROPONIN I
TROPONIN I: 10.17 ng/mL — AB (ref <0.30)
Troponin I: 9.22 ng/mL (ref <0.30)

## 2013-03-27 LAB — GI PATHOGEN PANEL BY PCR, STOOL
C difficile toxin A/B: NEGATIVE
CAMPYLOBACTER BY PCR: NEGATIVE
CRYPTOSPORIDIUM BY PCR: NEGATIVE
E coli (ETEC) LT/ST: NEGATIVE
E coli (STEC): NEGATIVE
E coli 0157 by PCR: NEGATIVE
G LAMBLIA BY PCR: NEGATIVE
NOROVIRUS G1/G2: NEGATIVE
ROTAVIRUS A BY PCR: NEGATIVE
SHIGELLA BY PCR: NEGATIVE
Salmonella by PCR: NEGATIVE

## 2013-03-27 LAB — GLUCOSE, CAPILLARY
GLUCOSE-CAPILLARY: 101 mg/dL — AB (ref 70–99)
Glucose-Capillary: 104 mg/dL — ABNORMAL HIGH (ref 70–99)
Glucose-Capillary: 113 mg/dL — ABNORMAL HIGH (ref 70–99)
Glucose-Capillary: 150 mg/dL — ABNORMAL HIGH (ref 70–99)
Glucose-Capillary: 95 mg/dL (ref 70–99)
Glucose-Capillary: 98 mg/dL (ref 70–99)

## 2013-03-27 LAB — HEMOGLOBIN A1C
Hgb A1c MFr Bld: 5.4 % (ref <5.7)
MEAN PLASMA GLUCOSE: 108 mg/dL (ref <117)

## 2013-03-27 LAB — LACTIC ACID, PLASMA: LACTIC ACID, VENOUS: 2 mmol/L (ref 0.5–2.2)

## 2013-03-27 LAB — MAGNESIUM: MAGNESIUM: 3.1 mg/dL — AB (ref 1.5–2.5)

## 2013-03-27 MED ORDER — MORPHINE SULFATE 2 MG/ML IJ SOLN
1.0000 mg | INTRAMUSCULAR | Status: DC | PRN
  Administered 2013-03-27: 1 mg via INTRAVENOUS
  Administered 2013-03-27 – 2013-03-28 (×2): 2 mg via INTRAVENOUS
  Administered 2013-03-28: 1 mg via INTRAVENOUS
  Administered 2013-03-29 – 2013-03-31 (×4): 2 mg via INTRAVENOUS
  Filled 2013-03-27 (×8): qty 1

## 2013-03-27 MED ORDER — SODIUM CHLORIDE 0.45 % IV SOLN
INTRAVENOUS | Status: DC
  Administered 2013-03-27: 03:00:00 via INTRAVENOUS

## 2013-03-27 MED ORDER — LACTATED RINGERS IV BOLUS (SEPSIS): 1000.0000 mL | Freq: Once | INTRAVENOUS | Status: DC

## 2013-03-27 MED ORDER — LACTATED RINGERS IV BOLUS (SEPSIS)
500.0000 mL | Freq: Once | INTRAVENOUS | Status: AC
  Administered 2013-03-27: 500 mL via INTRAVENOUS

## 2013-03-27 MED ORDER — POTASSIUM CHLORIDE 10 MEQ/100ML IV SOLN
10.0000 meq | INTRAVENOUS | Status: AC
  Filled 2013-03-27: qty 100

## 2013-03-27 MED ORDER — POTASSIUM CHLORIDE 10 MEQ/100ML IV SOLN
10.0000 meq | INTRAVENOUS | Status: AC
  Administered 2013-03-27 (×2): 10 meq via INTRAVENOUS
  Filled 2013-03-27: qty 100

## 2013-03-27 MED ORDER — LACTATED RINGERS IV SOLN
INTRAVENOUS | Status: DC
  Administered 2013-03-27: 20:00:00 via INTRAVENOUS
  Administered 2013-03-27: 125 mL/h via INTRAVENOUS

## 2013-03-27 MED ORDER — VANCOMYCIN HCL IN DEXTROSE 750-5 MG/150ML-% IV SOLN
750.0000 mg | INTRAVENOUS | Status: DC
  Administered 2013-03-28: 750 mg via INTRAVENOUS
  Filled 2013-03-27 (×2): qty 150

## 2013-03-27 MED ORDER — POTASSIUM CHLORIDE 10 MEQ/100ML IV SOLN
10.0000 meq | INTRAVENOUS | Status: AC
  Administered 2013-03-27: 10 meq via INTRAVENOUS
  Filled 2013-03-27 (×2): qty 100

## 2013-03-27 MED ORDER — LACTATED RINGERS IV BOLUS (SEPSIS)
1000.0000 mL | Freq: Once | INTRAVENOUS | Status: AC
  Administered 2013-03-27: 1000 mL via INTRAVENOUS

## 2013-03-27 MED ORDER — PIPERACILLIN-TAZOBACTAM IN DEX 2-0.25 GM/50ML IV SOLN
2.2500 g | Freq: Three times a day (TID) | INTRAVENOUS | Status: DC
  Administered 2013-03-27 – 2013-03-28 (×3): 2.25 g via INTRAVENOUS
  Filled 2013-03-27 (×5): qty 50

## 2013-03-27 NOTE — Progress Notes (Signed)
Lab had difficulty with drawing labs. Called MD to get order for drawing labs from foot.   Pt. BP 76/59. Notified MD. MD stated they would come look at pt. Will continue to monitor

## 2013-03-27 NOTE — Progress Notes (Addendum)
INITIAL NUTRITION ASSESSMENT  DOCUMENTATION CODES Per approved criteria  -Severe malnutrition in the context of chronic illness   INTERVENTION: Advance diet as medically appropriate, add interventions accordingly  Recommend Speech Path consult for swallow evaluation RD to follow for nutrition care plan  NUTRITION DIAGNOSIS: Inadequate oral intake related to inability to eat as evidenced by NPO status  Goal: Pt to meet >/= 90% of their estimated nutrition needs   Monitor:  PO diet advancement & intake, weight, labs, I/O's  Reason for Assessment: Malnutrition Screening Tool Report, Low [place]  80 y.o. female  Admitting Dx: Sepsis  ASSESSMENT: 80 year old with a PMH of DM type 2, basal cell carcinoma, right intertrochanteric femur fracture (s/p repair Dec 2014). The patient presents from SNF with AMS. When EMS arrived her BP was 60/40. Son provides most of the history. She was independent up until hip fracture 3 months ago. After surgery she was discharged to SNF where she has lived since. Her son reports periods of confusion, decreased po and N/V for the past month. He visit with her daily and notes she has good and bad days. He also notes that she has been treated with antibiotics for frequent UTIs recently. She was most recently treated 2 weeks ago. He reports increasing lethargy in past few days. She has not had diarrhea as far as he knows.   In the ED: T 98.19F, RR 18-33, SpO2 100% on 2L, HR 100-113, BP 74/56; she received 2L NSS, Vancomycin, Zosyn and Zofran. She had episode of dark emesis and bloody diarrhea in the ED.  RD unable to interview pt; seen per Clinical Nutrition December 2014 during hospitalization for hip surgery; GI note reviewed -- pt with GI bleeding; plan to initiate on Clear Liquids and defer endoscopic evaluation at this time; per weight readings, patient has had a 20% weight loss since December (severe for time frame).  Spoke with patient's daughter-in-law via  telephone (who is also a Data processing manager); reports pt has not eating well since her hip surgery; has been taking "bites" of food and has been spitting it out afterwards.  RD unable to complete Nutrition Focused Physical Exam at this time.  Patient meets criteria for severe malnutrition in the context of chronic illness as evidenced by < 75% intake of estimated energy requirement for > 1 month and 20% weight loss in < 3 months.  Height: Ht Readings from Last 1 Encounters:  03/26/13 5' (1.524 m)    Weight: Wt Readings from Last 1 Encounters:  03/27/13 133 lb 6.1 oz (60.5 kg)    Ideal Body Weight: 100 lb  % Ideal Body Weight: 133%  Wt Readings from Last 10 Encounters:  03/27/13 133 lb 6.1 oz (60.5 kg)  01/02/13 167 lb 8.8 oz (76 kg)  01/02/13 167 lb 8.8 oz (76 kg)    Usual Body Weight: 167 lb  % Usual Body Weight: 80%  BMI:  Body mass index is 26.05 kg/(m^2).  Estimated Nutritional Needs: Kcal: 1500-1700 Protein: 70-80 gm Fluid: 1.5-1.7 L  Skin: Intact  Diet Order: NPO  EDUCATION NEEDS: -No education needs identified at this time   Intake/Output Summary (Last 24 hours) at 03/27/13 1129 Last data filed at 03/27/13 0745  Gross per 24 hour  Intake 3832.92 ml  Output    675 ml  Net 3157.92 ml    Labs:   Recent Labs Lab 03/26/13 1725 03/27/13 03/27/13 0542  NA 156* 159* 158*  K 3.9 3.4* 3.3*  CL 115* 119*  122*  CO2 17* 20 17*  BUN 60* 67* 67*  CREATININE 1.95* 2.08* 2.01*  CALCIUM 7.9* 7.9* 7.8*  MG  --  3.1*  --   GLUCOSE 233* 154* 121*    CBG (last 3)   Recent Labs  03/26/13 2358 03/27/13 0429 03/27/13 0727  GLUCAP 150* 113* 95    Scheduled Meds: . antiseptic oral rinse  15 mL Mouth Rinse BID  . insulin aspart  0-9 Units Subcutaneous 6 times per day  . lactated ringers  500 mL Intravenous Once  . pantoprazole (PROTONIX) IV  40 mg Intravenous Q12H  . piperacillin-tazobactam (ZOSYN)  IV  2.25 g Intravenous 3 times per day  . sodium chloride   1,000 mL Intravenous Once  . sodium chloride  3 mL Intravenous Q12H  . [START ON 03/28/2013] vancomycin  750 mg Intravenous Q48H    Continuous Infusions: . lactated ringers 125 mL/hr (03/27/13 1050)    Past Medical History  Diagnosis Date  . Diabetes mellitus without complication   . Hypertension     Past Surgical History  Procedure Laterality Date  . Intramedullary (im) nail intertrochanteric Right 01/02/2013    Procedure: INTRAMEDULLARY (IM) NAIL INTERTROCHANTRIC RIGHT HIP;  Surgeon: Cheral AlmasNaiping Michael Xu, MD;  Location: [facility] OR;  Service: Orthopedics;  Laterality: Right;    Maureen ChattersKatie Analy Bassford, RD, LDN Pager #: [phone] After-Hours Pager #: [phone]

## 2013-03-27 NOTE — Progress Notes (Signed)
Pharmacy: Vancomycin and Zosyn  89yof started on empiric antibiotics for probable sepsis. Creatinine has increased today and she is not making much urine. Will adjust antibiotic doses.  Vancomycin 2/25>> Zosyn 2/25>>  2/25 c diff neg 2/25 Ucx>> 2/25 Bcx x 2>>  Plan: 1) Change vancomycin to 750mg  IV q48 2) Change zosyn to 2.25g IV q8  Louie CasaJennifer Jenaveve Fenstermaker, PharmD, BCPS 03/27/2013, 10:42 AM

## 2013-03-27 NOTE — Consult Note (Signed)
[facility] CONSULT Reason for consult: Lower G.I. Bleeding Referring Physician: internal medicine teaching service  Kaitlin Brennan is an 80 y.o. female.  HPI: the patient had repair of hip fracture 12/14 and has been rehabbing at [facility]. She was admitted last night would decreased oral intake can alter minimal status. There was question of loose stools. She had been on antibiotics for UTI recently by history. There was a history in the emergency room of dark emesis and diarrhea with streaks of blood. The patient's vital signs and been okay. WBC 31.7 in admission hemoglobin 14.3. Antibiotics started. Hemoglobin down slightly with fluids but no gross bleeding. Patient remains confused and is able to tell me she is not in any pain but unable to give any other meaningful history. The nursing staff notes that she has had no bowel movements since her admission last night. Stool is positive for occult blood C. difficile toxins negative.  Past Medical History  Diagnosis Date  . Diabetes mellitus without complication   . Hypertension     Past Surgical History  Procedure Laterality Date  . Intramedullary (im) nail intertrochanteric Right 01/02/2013    Procedure: INTRAMEDULLARY (IM) NAIL INTERTROCHANTRIC RIGHT HIP;  Surgeon: Marianna Payment, MD;  Location: [place];  Service: Orthopedics;  Laterality: Right;    Family History  Problem Relation Age of Onset  . Lung cancer Father     Social History:  reports that she has never smoked. She does not have any smokeless tobacco history on file. She reports that she does not drink alcohol or use illicit drugs.  Allergies: No Known Allergies  Medications; Prior to Admission medications   Medication Sig Start Date End Date Taking? Authorizing Provider  calcium-vitamin D (OSCAL WITH D) 500-200 MG-UNIT per tablet Take 1 tablet by mouth daily with breakfast.   Yes Historical Provider, MD  Cyanocobalamin (VITAMIN B-12 IJ) Inject 1,000 mcg as  directed every 30 (thirty) days.   Yes Historical Provider, MD  ferrous sulfate 325 (65 FE) MG tablet Take 325 mg by mouth daily with breakfast.   Yes Historical Provider, MD  lactobacillus acidophilus (BACID) TABS tablet Take 1 tablet by mouth 2 (two) times daily.   Yes Historical Provider, MD  LORazepam (ATIVAN) 0.5 MG tablet Take 0.5 mg by mouth every 6 (six) hours as needed for anxiety.   Yes Historical Provider, MD  Magnesium Oxide 400 MG CAPS Take 400 mg by mouth at bedtime.   Yes Historical Provider, MD  metFORMIN (GLUCOPHAGE) 500 MG tablet Take 500 mg by mouth daily with breakfast.   Yes Historical Provider, MD  mirtazapine (REMERON) 15 MG tablet Take 15 mg by mouth at bedtime.   Yes Historical Provider, MD  nystatin cream (MYCOSTATIN) Apply 1 application topically 3 (three) times daily. Vaginal area 03/13/13  Yes Historical Provider, MD  promethazine (PHENERGAN) 25 MG tablet Take 25 mg by mouth daily as needed for nausea or vomiting.   Yes Historical Provider, MD  senna (SENOKOT) 8.6 MG TABS tablet Take 1 tablet (8.6 mg total) by mouth 2 (two) times daily. 01/06/13  Yes Nishant Dhungel, MD  Vitamin D, Ergocalciferol, (DRISDOL) 50000 UNITS CAPS capsule Take 1 capsule (50,000 Units total) by mouth every 7 (seven) days. 01/06/13  Yes Nishant Dhungel, MD   . antiseptic oral rinse  15 mL Mouth Rinse BID  . insulin aspart  0-9 Units Subcutaneous 6 times per day  . pantoprazole (PROTONIX) IV  40 mg Intravenous Q12H  . piperacillin-tazobactam (ZOSYN)  IV  3.375 g Intravenous 3 times per day  . potassium chloride  10 mEq Intravenous Q1 Hr x 3  . sodium chloride  1,000 mL Intravenous Once  . sodium chloride  3 mL Intravenous Q12H  . vancomycin  750 mg Intravenous Q24H   PRN Meds  Results for orders placed during the hospital encounter of 03/26/13 (from the past 48 hour(s))  CBC WITH DIFFERENTIAL     Status: Abnormal   Collection Time    03/26/13 12:00 PM      Result Value Ref Range   WBC 31.7  (*) 4.0 - 10.5 K/uL   RBC 4.99  3.87 - 5.11 MIL/uL   Hemoglobin 14.3  12.0 - 15.0 g/dL   HCT 44.6  36.0 - 46.0 %   MCV 89.4  78.0 - 100.0 fL   MCH 28.7  26.0 - 34.0 pg   MCHC 32.1  30.0 - 36.0 g/dL   RDW 19.9 (*) 11.5 - 15.5 %   Platelets 201  150 - 400 K/uL   Neutrophils Relative % 91 (*) 43 - 77 %   Lymphocytes Relative 5 (*) 12 - 46 %   Monocytes Relative 4  3 - 12 %   Eosinophils Relative 0  0 - 5 %   Basophils Relative 0  0 - 1 %   Neutro Abs 28.8 (*) 1.7 - 7.7 K/uL   Lymphs Abs 1.6  0.7 - 4.0 K/uL   Monocytes Absolute 1.3 (*) 0.1 - 1.0 K/uL   Eosinophils Absolute 0.0  0.0 - 0.7 K/uL   Basophils Absolute 0.0  0.0 - 0.1 K/uL   RBC Morphology ACANTHOCYTES    URINALYSIS, ROUTINE W REFLEX MICROSCOPIC     Status: Abnormal   Collection Time    03/26/13 12:25 PM      Result Value Ref Range   Color, Urine AMBER (*) YELLOW   Comment: BIOCHEMICALS MAY BE AFFECTED BY COLOR   APPearance HAZY (*) CLEAR   Specific Gravity, Urine 1.024  1.005 - 1.030   pH 5.0  5.0 - 8.0   Glucose, UA 100 (*) NEGATIVE mg/dL   Hgb urine dipstick NEGATIVE  NEGATIVE   Bilirubin Urine MODERATE (*) NEGATIVE   Ketones, ur 15 (*) NEGATIVE mg/dL   Protein, ur NEGATIVE  NEGATIVE mg/dL   Urobilinogen, UA 0.2  0.0 - 1.0 mg/dL   Nitrite NEGATIVE  NEGATIVE   Leukocytes, UA SMALL (*) NEGATIVE  URINE MICROSCOPIC-ADD ON     Status: Abnormal   Collection Time    03/26/13 12:25 PM      Result Value Ref Range   Squamous Epithelial / LPF RARE  RARE   WBC, UA 3-6  <3 WBC/hpf   RBC / HPF 0-2  <3 RBC/hpf   Bacteria, UA FEW (*) RARE   Casts HYALINE CASTS (*) NEGATIVE   Comment: GRANULAR CAST   Urine-Other AMORPHOUS URATES/PHOSPHATES    I-STAT CG4 LACTIC ACID, ED     Status: Abnormal   Collection Time    03/26/13 12:34 PM      Result Value Ref Range   Lactic Acid, Venous 14.25 (*) 0.5 - 2.2 mmol/Brennan  I-STAT VENOUS BLOOD GAS, ED     Status: Abnormal   Collection Time    03/26/13 12:34 PM      Result Value Ref Range    pH, Ven 7.202 (*) 7.250 - 7.300   pCO2, Ven 34.1 (*) 45.0 - 50.0 mmHg   pO2, Ven 40.0  30.0 - 45.0  mmHg   Bicarbonate 13.4 (*) 20.0 - 24.0 mEq/Brennan   TCO2 14  0 - 100 mmol/Brennan   O2 Saturation 64.0     Acid-base deficit 14.0 (*) 0.0 - 2.0 mmol/Brennan   Sample type VENOUS    POC OCCULT BLOOD, ED     Status: Abnormal   Collection Time    03/26/13  3:54 PM      Result Value Ref Range   Fecal Occult Bld POSITIVE (*) NEGATIVE  CLOSTRIDIUM DIFFICILE BY PCR     Status: None   Collection Time    03/26/13  4:03 PM      Result Value Ref Range   C difficile by pcr NEGATIVE  NEGATIVE  TROPONIN I     Status: Abnormal   Collection Time    03/26/13  5:25 PM      Result Value Ref Range   Troponin I 4.37 (*) <0.30 ng/mL   Comment:            Due to the release kinetics of cTnI,     a negative result within the first hours     of the onset of symptoms does not rule out     myocardial infarction with certainty.     If myocardial infarction is still suspected,     repeat the test at appropriate intervals.     CRITICAL RESULT CALLED TO, READ BACK BY AND VERIFIED WITH:     B BERARD,RN [id number] 03/26/13 D BRADLEY  COMPREHENSIVE METABOLIC PANEL     Status: Abnormal   Collection Time    03/26/13  5:25 PM      Result Value Ref Range   Sodium 156 (*) 137 - 147 mEq/Brennan   Potassium 3.9  3.7 - 5.3 mEq/Brennan   Chloride 115 (*) 96 - 112 mEq/Brennan   CO2 17 (*) 19 - 32 mEq/Brennan   Glucose, Bld 233 (*) 70 - 99 mg/dL   BUN 60 (*) 6 - 23 mg/dL   Creatinine, Ser 1.95 (*) 0.50 - 1.10 mg/dL   Calcium 7.9 (*) 8.4 - 10.5 mg/dL   Total Protein 5.4 (*) 6.0 - 8.3 g/dL   Albumin 2.0 (*) 3.5 - 5.2 g/dL   AST 730 (*) 0 - 37 U/Brennan   ALT 508 (*) 0 - 35 U/Brennan   Alkaline Phosphatase 147 (*) 39 - 117 U/Brennan   Total Bilirubin 0.5  0.3 - 1.2 mg/dL   GFR calc non Af Amer 22 (*) >90 mL/min   GFR calc Af Amer 25 (*) >90 mL/min   Comment: (NOTE)     The eGFR has been calculated using the CKD EPI equation.     This calculation has not been validated in  all clinical situations.     eGFR's persistently <90 mL/min signify possible Chronic Kidney     Disease.  GLUCOSE, CAPILLARY     Status: Abnormal   Collection Time    03/26/13  7:44 PM      Result Value Ref Range   Glucose-Capillary 201 (*) 70 - 99 mg/dL  MRSA PCR SCREENING     Status: Abnormal   Collection Time    03/26/13  8:02 PM      Result Value Ref Range   MRSA by PCR POSITIVE (*) NEGATIVE   Comment:            The GeneXpert MRSA Assay (FDA     approved for NASAL specimens     only), is one component of  a     comprehensive MRSA colonization     surveillance program. It is not     intended to diagnose MRSA     infection nor to guide or     monitor treatment for     MRSA infections.     RESULT CALLED TO, READ BACK BY AND VERIFIED WITH:     Melody Comas RN [id number] 03/26/13 A BROWNING  HEMOGLOBIN A1C     Status: None   Collection Time    03/26/13  8:28 PM      Result Value Ref Range   Hemoglobin A1C 5.4  <5.7 %   Comment: (NOTE)                                                                               According to the ADA Clinical Practice Recommendations for 2011, when     HbA1c is used as a screening test:      >=6.5%   Diagnostic of Diabetes Mellitus               (if abnormal result is confirmed)     5.7-6.4%   Increased risk of developing Diabetes Mellitus     References:Diagnosis and Classification of Diabetes Mellitus,Diabetes     CZYS,0630,16(WFUXN 1):S62-S69 and Standards of Medical Care in             Diabetes - 2011,Diabetes Care,2011,34 (Suppl 1):S11-S61.   Mean Plasma Glucose 108  <117 mg/dL   Comment: Performed at [facility]  CBC     Status: Abnormal   Collection Time    03/26/13  8:28 PM      Result Value Ref Range   WBC 30.2 (*) 4.0 - 10.5 K/uL   RBC 4.66  3.87 - 5.11 MIL/uL   Hemoglobin 13.2  12.0 - 15.0 g/dL   HCT 40.7  36.0 - 46.0 %   MCV 87.3  78.0 - 100.0 fL   MCH 28.3  26.0 - 34.0 pg   MCHC 32.4  30.0 - 36.0 g/dL   RDW 20.0 (*) 11.5 -  15.5 %   Platelets 217  150 - 400 K/uL  TYPE AND SCREEN     Status: None   Collection Time    03/26/13  8:35 PM      Result Value Ref Range   ABO/RH(D) O POS     Antibody Screen NEG     Sample Expiration 03/29/2013    TROPONIN I     Status: Abnormal   Collection Time    03/26/13 11:48 PM      Result Value Ref Range   Troponin I 10.17 (*) <0.30 ng/mL   Comment:            Due to the release kinetics of cTnI,     a negative result within the first hours     of the onset of symptoms does not rule out     myocardial infarction with certainty.     If myocardial infarction is still suspected,     repeat the test at appropriate intervals.     CRITICAL VALUE NOTED.  VALUE IS CONSISTENT WITH PREVIOUSLY REPORTED AND CALLED VALUE.  GLUCOSE, CAPILLARY     Status: Abnormal   Collection Time    03/26/13 11:58 PM      Result Value Ref Range   Glucose-Capillary 150 (*) 70 - 99 mg/dL  COMPREHENSIVE METABOLIC PANEL     Status: Abnormal   Collection Time    03/27/13 12:00 AM      Result Value Ref Range   Sodium 159 (*) 137 - 147 mEq/Brennan   Potassium 3.4 (*) 3.7 - 5.3 mEq/Brennan   Chloride 119 (*) 96 - 112 mEq/Brennan   CO2 20  19 - 32 mEq/Brennan   Glucose, Bld 154 (*) 70 - 99 mg/dL   BUN 67 (*) 6 - 23 mg/dL   Creatinine, Ser 2.08 (*) 0.50 - 1.10 mg/dL   Calcium 7.9 (*) 8.4 - 10.5 mg/dL   Total Protein 5.1 (*) 6.0 - 8.3 g/dL   Albumin 1.8 (*) 3.5 - 5.2 g/dL   AST 449 (*) 0 - 37 U/Brennan   ALT 432 (*) 0 - 35 U/Brennan   Alkaline Phosphatase 142 (*) 39 - 117 U/Brennan   Total Bilirubin 0.4  0.3 - 1.2 mg/dL   GFR calc non Af Amer 20 (*) >90 mL/min   GFR calc Af Amer 23 (*) >90 mL/min   Comment: (NOTE)     The eGFR has been calculated using the CKD EPI equation.     This calculation has not been validated in all clinical situations.     eGFR's persistently <90 mL/min signify possible Chronic Kidney     Disease.  CBC     Status: Abnormal   Collection Time    03/27/13 12:00 AM      Result Value Ref Range   WBC 24.4 (*)  4.0 - 10.5 K/uL   RBC 4.44  3.87 - 5.11 MIL/uL   Hemoglobin 12.3  12.0 - 15.0 g/dL   HCT 38.5  36.0 - 46.0 %   MCV 86.7  78.0 - 100.0 fL   MCH 27.7  26.0 - 34.0 pg   MCHC 31.9  30.0 - 36.0 g/dL   RDW 20.0 (*) 11.5 - 15.5 %   Platelets 200  150 - 400 K/uL  MAGNESIUM     Status: Abnormal   Collection Time    03/27/13 12:00 AM      Result Value Ref Range   Magnesium 3.1 (*) 1.5 - 2.5 mg/dL  GLUCOSE, CAPILLARY     Status: Abnormal   Collection Time    03/27/13  4:29 AM      Result Value Ref Range   Glucose-Capillary 113 (*) 70 - 99 mg/dL  CBC     Status: Abnormal   Collection Time    03/27/13  5:42 AM      Result Value Ref Range   WBC 21.7 (*) 4.0 - 10.5 K/uL   RBC 4.25  3.87 - 5.11 MIL/uL   Hemoglobin 12.0  12.0 - 15.0 g/dL   HCT 36.6  36.0 - 46.0 %   MCV 86.1  78.0 - 100.0 fL   MCH 28.2  26.0 - 34.0 pg   MCHC 32.8  30.0 - 36.0 g/dL   RDW 19.8 (*) 11.5 - 15.5 %   Platelets 182  150 - 400 K/uL    Dg Chest Port 1 View  03/26/2013   CLINICAL DATA:  Shortness of breath and weakness  EXAM: PORTABLE CHEST - 1 VIEW  COMPARISON:  01/02/2013  FINDINGS: Fine and coarse interstitial opacities diffusely. Lung volumes appear  decreased. No cardiomegaly. Limited assessment of the hila and upper mediastinal contours due to rightward rotation. No evidence of acute superimposed disease, including edema or consolidation. No effusion or pneumothorax.  IMPRESSION: Interstitial lung disease without evidence of acute superimposed process.   Electronically Signed   By: Jorje Guild M.D.   On: 03/26/2013 13:27               Blood pressure 102/50, pulse 88, temperature 97.9 F (36.6 C), temperature source Axillary, resp. rate 19, height 5' (1.524 m), weight 60.5 kg (133 lb 6.1 oz), SpO2 100.00%.  Physical exam:   General-- pleasantly confused white female in no distress Heart-- diminished heart sounds no murmurs Lungs--clear Abdomen-- soft and nontender with good bowel  sounds   Assessment: 1. G.I. bleeding. Hemoglobin is stable and has not really dropped with hydration. The cause of or bleeding is unclear but does not appear to be very severe. She clearly has multiple other severe problems..  2. Sepsis. Source unclear patient  on empiric antibiotics 3. Altered mental status. Some probably acute May be chronic as well  4. Acute on chronic renal failure   Plan: 1. We will follow with you. At this point she appears hemodynamically stable. 2. Would give protonix empirically. 3. Would begin clear liquids and see what happens with her lower G.I. bleeding and diarrhea. 4. Deferring the endoscopic evaluation until other problems have resolved and only if she continues to bleed.   Kaitlin Brennan,Kaitlin Brennan 03/27/2013, 7:16 AM

## 2013-03-27 NOTE — Progress Notes (Signed)
Pt. Confused and pulling at leads, nasal canula and other lines. Placed safety mittens on pt. Pt. Pulled off safety mittens and tore skin. Gave PRN morphine 2mg . Pt. Still pulling at lines. Paged MD for PRN haldol or wrist restraints. Will continue to monitor

## 2013-03-27 NOTE — Progress Notes (Signed)
Dr. Harmon DunEric Hoffman notified that lab was having an extremely difficult time obtaining cbc. PICC will not be placed until tomorrow per IV team. Also notified that the pt had not received the ordered runs of K due to the medication not arriving to the floor despite several calls and notifications. Pt has no cardiac irregularities currently.

## 2013-03-27 NOTE — Progress Notes (Signed)
Subjective: Kaitlin Brennan had was seen and examined this morning.  She is thirsty.  She denies pain.  Knows who she is and when asked where she is says we are "taking care of her."  Objective: Vital signs in last 24 hours: Filed Vitals:   03/26/13 1942 03/26/13 2359 03/27/13 0400 03/27/13 0744  BP: 108/51 93/48 102/50 89/52  Pulse: 100 93 88 93  Temp: 97.5 F (36.4 C) 97.9 F (36.6 C) 97.9 F (36.6 C) 97.4 F (36.3 C)  TempSrc: Axillary Axillary Axillary Axillary  Resp: 22 29 19 17   Height:      Weight:   60.5 kg (133 lb 6.1 oz)   SpO2: 100% 100% 100% 96%   Weight change:   Intake/Output Summary (Last 24 hours) at 03/27/13 0813 Last data filed at 03/27/13 0745  Gross per 24 hour  Intake 3832.92 ml  Output    675 ml  Net 3157.92 ml   General: resting in bed in NAD, awake and listening to tv HEENT:  Dry mucous membranes Cardiac: RRR, no rubs, murmurs or gallops Pulm: clear to auscultation bilaterally, moving normal volumes of air Abd: soft, nontender, nondistended Ext: warm and well perfused, no pedal edema, 2+ DPs Neuro: alert and oriented X1-2, cannot tell me the year, knows we are here to care for her but unsure if she knows she is in the hospital  Lab Results: Basic Metabolic Panel:  Recent Labs Lab 03/26/13 1725 03/27/13 03/27/13 0542  NA 156* 159* 158*  K 3.9 3.4* 3.3*  CL 115* 119* 122*  CO2 17* 20 17*  GLUCOSE 233* 154* 121*  BUN 60* 67* 67*  CREATININE 1.95* 2.08* 2.01*  CALCIUM 7.9* 7.9* 7.8*  MG  --  3.1*  --    Liver Function Tests:  Recent Labs Lab 03/27/13 03/27/13 0542  AST 449* 321*  ALT 432* 376*  ALKPHOS 142* 122*  BILITOT 0.4 0.4  PROT 5.1* 5.0*  ALBUMIN 1.8* 1.7*   CBC:  Recent Labs Lab 03/26/13 1200  03/27/13 03/27/13 0542  WBC 31.7*  < > 24.4* 21.7*  NEUTROABS 28.8*  --   --   --   HGB 14.3  < > 12.3 12.0  HCT 44.6  < > 38.5 36.6  MCV 89.4  < > 86.7 86.1  PLT 201  < > 200 182  < > = values in this interval not  displayed. Cardiac Enzymes:  Recent Labs Lab 03/26/13 1725 03/26/13 2348 03/27/13 0542  TROPONINI 4.37* 10.17* 9.22*   CBG:  Recent Labs Lab 03/26/13 1944 03/26/13 2358 03/27/13 0429 03/27/13 0727  GLUCAP 201* 150* 113* 95   Medications: I have reviewed the patient's current medications. Scheduled Meds: . antiseptic oral rinse  15 mL Mouth Rinse BID  . insulin aspart  0-9 Units Subcutaneous 6 times per day  . lactated ringers  500 mL Intravenous Once  . pantoprazole (PROTONIX) IV  40 mg Intravenous Q12H  . piperacillin-tazobactam (ZOSYN)  IV  2.25 g Intravenous 3 times per day  . sodium chloride  1,000 mL Intravenous Once  . sodium chloride  3 mL Intravenous Q12H  . [START ON 03/28/2013] vancomycin  750 mg Intravenous Q48H   Continuous Infusions: . lactated ringers 125 mL/hr (03/27/13 1050)   Assessment/Plan: 80 year old woman with DM type 2, basal cell carcinoma, right intertrochanteric femur fracture (s/p repair Dec 2014) who presents with AMS and hypotension.   Severe sepsis: Tachycardia, tachypneic, WBC 31.7, Evidence of hypoperfusion: Hypotension,  lactic acid 14.25, creatinine, liver enzymes and troponin elevated. Potential sources include urine (UA positive for leukocytes) vs GI (vomiting and diarrhea). Hypotension responding to IV fluids.  Less concern for infection today given improved lactic acid this AM, 14.25--> 2.0 and urine culture negative.   - continue hydration:  NSS --> LR 250cc/hr x 4 hours, then 150cc/hr maintenance - blood and urine cultures pending - NGTD - continue empiric antibiotics - Vancomycin and Zosyn per pharmacy; if she improves in the next 48 hours with volume resuscitation and there is no source of infection will d/c antibiotics - Zofran prn - stool pathogen panel pending - PICC insertion because patient has poor peripheral venous access - CMP and CBC q12h  GI bleed: FOBT + and two episodes of diarrhea in the ED (one with gross blood per  nurse). Dark (but not coffee ground) emesis.  Several dark BMs last night.  Hgb 14.3--> 12.0 after IV fluids. GI consulted and recommendations appreciated.  Source of bleeding unclear but does not appear severe.  Deferring endoscopy until other medical issues resolved and only if continued bleeding. - IV protonix  - trial of clear liquids after SLP evaluation - stool pathogen panel pending   Hypernatremia: Na 156->159->158 likely 2/2 to dehydration. Son reports poor po for past month.  Also with N/V, diarrhea.  This has all likely led to her hypovolemia and presentation with hypotension, pre-renal azotemia and ischemic hepatitis.  - continue fluid resuscitation NSS--> LR 250cc/hr x 4 hours, then 150cc/hr maintenance  Positive troponin: Trop 4.37--> 10.17--> 9.22. Likely 2/2 to demand ischemia. Spoke with cardiology and given GI bleed not a candidate for heparin or ASA.   DM type 2: Hgb A1c 7.04 Jan 2013. Home medication is metformin.  Hgb stable 95-150 on SSI sensitive.  - continue SSI sensitive   Diet:  NPO until SLP; can try clear liquid diet if passes SLP  VTE ppx: SCDs only in the setting of GI bleed  Dispo: Disposition is deferred at this time, awaiting improvement of current medical problems.  Anticipated discharge in approximately 1-2 day(s).   The patient does not have a current PCP (No Pcp Per Patient) and [facility] after [facility].  The patient does not know have transportation limitations that hinder transportation to clinic appointments.  .Services Needed at time of discharge: Y = Yes, Blank = No PT:   OT:   RN:   Equipment:   Other:     LOS: 1 day   Kaitlin PeatAlex Wilson, DO 03/27/2013, 8:13 AM

## 2013-03-27 NOTE — Progress Notes (Signed)
Per Dr. Andrey CampanileWilson, ok to place PICC with respect to Creatinine and GFR.

## 2013-03-27 NOTE — H&P (Signed)
Internal Medicine Attending Admission Note Date: 03/27/2013  Patient name: Genell Durney Medical record number: [id number] Date of birth: 1921/05/03 Age: 80 y.o. Gender: female  I saw and evaluated the patient. I reviewed the resident's note and I agree with the resident's findings and plan as documented in the resident's note.  Ms. Dix is an 80 year old woman with a history of diabetes and basal cell carcinoma who presents from the skilled nursing facility with acute delirium. Prior to December 2014 she was living independently. In December she fell and fractured her hip. This required repair and she was subsequently transferred to a skilled nursing facility where she has been for the last 3 months. While in the skilled nursing facility she has not been doing well according to her son. She's had periods of confusion on and off for the last one month. She's also had decreased oral intake with significant nausea and vomiting. Although diarrhea was not reported she did have a loose stool while in the emergency department. She's also had several UTIs since being in the [name] which have been treated with oral antibiotics. Over the last few days she's had increasing lethargy and confusion. She was therefore brought to the emergency department for further evaluation. In the emergency department she was found to be hypotensive and was therefore admitted to the internal medicine teaching service for further evaluation and care.  Since she was DO NOT RESUSCITATE/DO NOT INTUBATE she was admitted to the step down unit rather than the intensive care unit. In the step down unit she's been aggressively hydrated with normal saline with some improvement in her hypotension. An evaluation for infection was relatively unremarkable. Her chest x-ray demonstrated chronic interstitial changes which are unchanged from the previous chest x-ray. Her urinalysis was notable only for 3-6 white blood cells. Her kidney function was  markedly decreased with a creatinine of near 2 and she had a significant transaminitis as well. Her anion gap was elevated and her lactic acid was 14.25. Finally, she was noted to have some blood in her stool although, despite aggressive hydration her hematocrit has remained relatively stable dropping only to the mid 30s. Her troponins have peaked at 10. Examination reveals an elderly woman lying comfortably in bed in no acute distress. Her mucous membranes are very dry and she complains of significant thirst. Her cardiac and lung exams are unremarkable and her abdomen is soft and nontender. She is without edema.  It is my impression that she is significantly hypovolemic from the very poor oral intake as well as recent nausea, vomiting and possibly diarrhea. This has led to her hypotension as well as her prerenal azotemia, possible ATN, and shock liver. She also has a significant free water deficit that we'll eventually have to be replaced once she is volume resuscitated. If she has a GI bleed I believe it is minor and is not playing a role in her current hypotension. There is the possibility she has a healthcare associated infection if this represents sepsis.  For her hypovolemia we will aggressively hydrate with lactated Ringer's 250 cc an hour for 4 hours and then reassess. At the time of reassessment we will place her on a maintenance rate of at least 150 cc per hour making sure she does not desaturate. Once we feel she's been adequately volume resuscitated we will start working on repleting her free water deficit. We will continue to monitor her kidney function and her electrolytes. I suspect with volume resuscitation we should see improvement not  only in her electrolytes and kidney function but also in her transaminitis. I would not be surprised if she were to develop more acute tubular necrosis but we're hopeful this will not occur. For her possible GI bleed we will simply follow serial hematocrits  expecting them to drop somewhat with volume resuscitation. Finally, since we cannot rule out an infection as a cause of her hypotension we will empirically treat with broad-spectrum antibiotics and followup on the culture results that were obtained. If she improves with volume resuscitation and her cultures remained negative for the next 48 hours we will likely stop the antibiotics at that time if a source cannot be definitively identified. It is very likely that the myocardial infarction she had suffered is secondary to demand ischemia related to her significant hypotension from the hypovolemia. She remains critically ill but I am hopeful that with the above we will be able to reverse her current process.

## 2013-03-28 DIAGNOSIS — N289 Disorder of kidney and ureter, unspecified: Secondary | ICD-10-CM | POA: Diagnosis present

## 2013-03-28 DIAGNOSIS — K922 Gastrointestinal hemorrhage, unspecified: Secondary | ICD-10-CM | POA: Diagnosis present

## 2013-03-28 DIAGNOSIS — D649 Anemia, unspecified: Secondary | ICD-10-CM | POA: Diagnosis present

## 2013-03-28 DIAGNOSIS — S329XXA Fracture of unspecified parts of lumbosacral spine and pelvis, initial encounter for closed fracture: Secondary | ICD-10-CM | POA: Diagnosis present

## 2013-03-28 DIAGNOSIS — R7881 Bacteremia: Secondary | ICD-10-CM | POA: Diagnosis present

## 2013-03-28 LAB — GLUCOSE, CAPILLARY
Glucose-Capillary: 102 mg/dL — ABNORMAL HIGH (ref 70–99)
Glucose-Capillary: 102 mg/dL — ABNORMAL HIGH (ref 70–99)
Glucose-Capillary: 106 mg/dL — ABNORMAL HIGH (ref 70–99)
Glucose-Capillary: 85 mg/dL (ref 70–99)
Glucose-Capillary: 86 mg/dL (ref 70–99)
Glucose-Capillary: 91 mg/dL (ref 70–99)

## 2013-03-28 LAB — COMPREHENSIVE METABOLIC PANEL
ALBUMIN: 1.5 g/dL — AB (ref 3.5–5.2)
ALK PHOS: 118 U/L — AB (ref 39–117)
ALT: 241 U/L — ABNORMAL HIGH (ref 0–35)
ALT: 170 U/L — ABNORMAL HIGH (ref 0–35)
AST: 127 U/L — ABNORMAL HIGH (ref 0–37)
AST: 68 U/L — ABNORMAL HIGH (ref 0–37)
Albumin: 1.7 g/dL — ABNORMAL LOW (ref 3.5–5.2)
Alkaline Phosphatase: 100 U/L (ref 39–117)
BUN: 52 mg/dL — AB (ref 6–23)
BUN: 40 mg/dL — ABNORMAL HIGH (ref 6–23)
CO2: 20 mEq/L (ref 19–32)
CO2: 23 mEq/L (ref 19–32)
CREATININE: 1.73 mg/dL — AB (ref 0.50–1.10)
Calcium: 8.3 mg/dL — ABNORMAL LOW (ref 8.4–10.5)
Calcium: 7.9 mg/dL — ABNORMAL LOW (ref 8.4–10.5)
Chloride: 125 mEq/L — ABNORMAL HIGH (ref 96–112)
Chloride: 125 mEq/L — ABNORMAL HIGH (ref 96–112)
Creatinine, Ser: 1.55 mg/dL — ABNORMAL HIGH (ref 0.50–1.10)
GFR calc Af Amer: 33 mL/min — ABNORMAL LOW (ref >90)
GFR calc non Af Amer: 25 mL/min — ABNORMAL LOW (ref >90)
GFR calc non Af Amer: 29 mL/min — ABNORMAL LOW (ref >90)
GFR, EST AFRICAN AMERICAN: 29 mL/min — AB (ref >90)
GLUCOSE: 107 mg/dL — AB (ref 70–99)
Glucose, Bld: 114 mg/dL — ABNORMAL HIGH (ref 70–99)
POTASSIUM: 4.3 meq/L (ref 3.7–5.3)
Potassium: 3.5 mEq/L — ABNORMAL LOW (ref 3.7–5.3)
SODIUM: 159 meq/L — AB (ref 137–147)
Sodium: 158 mEq/L — ABNORMAL HIGH (ref 137–147)
TOTAL PROTEIN: 5 g/dL — AB (ref 6.0–8.3)
TOTAL PROTEIN: 4.5 g/dL — AB (ref 6.0–8.3)
Total Bilirubin: 0.4 mg/dL (ref 0.3–1.2)
Total Bilirubin: 0.4 mg/dL (ref 0.3–1.2)

## 2013-03-28 LAB — CBC
HCT: 33.6 % — ABNORMAL LOW (ref 36.0–46.0)
HEMATOCRIT: 34.6 % — AB (ref 36.0–46.0)
HEMOGLOBIN: 11.2 g/dL — AB (ref 12.0–15.0)
HEMOGLOBIN: 10.6 g/dL — AB (ref 12.0–15.0)
MCH: 28.1 pg (ref 26.0–34.0)
MCH: 27.7 pg (ref 26.0–34.0)
MCHC: 32.4 g/dL (ref 30.0–36.0)
MCHC: 31.5 g/dL (ref 30.0–36.0)
MCV: 86.9 fL (ref 78.0–100.0)
MCV: 87.7 fL (ref 78.0–100.0)
Platelets: 181 10*3/uL (ref 150–400)
Platelets: 170 10*3/uL (ref 150–400)
RBC: 3.98 MIL/uL (ref 3.87–5.11)
RBC: 3.83 MIL/uL — ABNORMAL LOW (ref 3.87–5.11)
RDW: 19.9 % — ABNORMAL HIGH (ref 11.5–15.5)
RDW: 20.1 % — ABNORMAL HIGH (ref 11.5–15.5)
WBC: 18.5 10*3/uL — ABNORMAL HIGH (ref 4.0–10.5)
WBC: 16.3 10*3/uL — ABNORMAL HIGH (ref 4.0–10.5)

## 2013-03-28 LAB — BASIC METABOLIC PANEL
BUN: 39 mg/dL — AB (ref 6–23)
CHLORIDE: 111 meq/L (ref 96–112)
CO2: 21 mEq/L (ref 19–32)
Calcium: 7.6 mg/dL — ABNORMAL LOW (ref 8.4–10.5)
Creatinine, Ser: 1.41 mg/dL — ABNORMAL HIGH (ref 0.50–1.10)
GFR, EST AFRICAN AMERICAN: 37 mL/min — AB (ref >90)
GFR, EST NON AFRICAN AMERICAN: 32 mL/min — AB (ref >90)
Glucose, Bld: 421 mg/dL — ABNORMAL HIGH (ref 70–99)
POTASSIUM: 3.5 meq/L — AB (ref 3.7–5.3)
SODIUM: 144 meq/L (ref 137–147)

## 2013-03-28 MED ORDER — SODIUM CHLORIDE 0.9 % IJ SOLN
10.0000 mL | INTRAMUSCULAR | Status: DC | PRN
  Administered 2013-03-31 – 2013-04-01 (×2): 10 mL
  Administered 2013-04-01: 20 mL
  Administered 2013-04-01: 10 mL

## 2013-03-28 MED ORDER — DEXTROSE 5 % IV SOLN
INTRAVENOUS | Status: DC
  Administered 2013-03-28: 14:00:00 via INTRAVENOUS

## 2013-03-28 MED ORDER — LORAZEPAM 2 MG/ML IJ SOLN
0.2500 mg | Freq: Once | INTRAMUSCULAR | Status: DC
  Filled 2013-03-28: qty 1

## 2013-03-28 MED ORDER — SODIUM CHLORIDE 0.9 % IJ SOLN
10.0000 mL | Freq: Two times a day (BID) | INTRAMUSCULAR | Status: DC
  Administered 2013-03-28 – 2013-04-01 (×6): 10 mL

## 2013-03-28 MED ORDER — POTASSIUM CHLORIDE 10 MEQ/100ML IV SOLN
10.0000 meq | INTRAVENOUS | Status: AC
  Administered 2013-03-28 – 2013-03-29 (×3): 10 meq via INTRAVENOUS
  Filled 2013-03-28 (×3): qty 100

## 2013-03-28 MED ORDER — DEXTROSE 5 % IV SOLN
INTRAVENOUS | Status: DC
  Administered 2013-03-28 – 2013-03-29 (×2): via INTRAVENOUS

## 2013-03-28 MED ORDER — CHLORHEXIDINE GLUCONATE 0.12 % MT SOLN
15.0000 mL | Freq: Two times a day (BID) | OROMUCOSAL | Status: DC
  Administered 2013-03-28 – 2013-04-01 (×8): 15 mL via OROMUCOSAL
  Filled 2013-03-28 (×11): qty 15

## 2013-03-28 MED ORDER — LACTATED RINGERS IV SOLN
INTRAVENOUS | Status: DC
  Administered 2013-03-28: 20:00:00 via INTRAVENOUS

## 2013-03-28 NOTE — Progress Notes (Signed)
Foam dressings applied to skin tears bil arms.

## 2013-03-28 NOTE — Consult Note (Signed)
[facility]    Date of Admission:  03/26/2013           Day 3 vancomycin        Day 3 piperacillin tazobactam       Reason for Consult: Staph aureus bacteremia    Referring Physician: Automatic consultation policy  Principal Problem:   MRSA bacteremia Active Problems:   Sepsis associated hypotension   Leucocytosis   Diabetes mellitus, type 2   Protein-calorie malnutrition, severe   Acute renal insufficiency   GI bleed   Pelvic fracture   Normocytic anemia   . antiseptic oral rinse  15 mL Mouth Rinse BID  . chlorhexidine  15 mL Mouth Rinse BID  . insulin aspart  0-9 Units Subcutaneous 6 times per day  . LORazepam  0.25 mg Intravenous Once  . pantoprazole (PROTONIX) IV  40 mg Intravenous Q12H  . sodium chloride  1,000 mL Intravenous Once  . sodium chloride  10-40 mL Intracatheter Q12H  . sodium chloride  3 mL Intravenous Q12H  . vancomycin  750 mg Intravenous Q48H    Recommendations: 1. Continue vancomycin 2. Discontinue piperacillin tazobactam 3. Await results of repeat blood cultures 4. Please call Dr. Judyann Munson [phone]) for infectious disease questions this weekend   Assessment: [name] has MRSA bacteremia with sepsis and multiorgan dysfunction. Given her advanced age, recent significant weight loss and protein calorie malnutrition her overall prognosis is very poor. I would continue vancomycin alone for now pending repeat blood culture results. I would suggest revisiting goals of care with her son before making a final decision about echocardiography early next week.   HPI: Kaitlin Brennan is a 80 y.o. female who was living independently on no medications until she suffered a fall on December 3 of last year sustaining  right hip and pelvic fractures. She had surgery on her hip fracture and was discharged to a skilled nursing facility on December 8. According to admission notes, her son indicates that she has had  intermittent confusion since that time. It appears she has been treated for several urinary tract infections. She was readmitted to the hospital on February 25 with hypotension, delirium, dehydration, lactic acidosis and a GI bleed. One of 2 admission blood cultures is growing MRSA.   Review of Systems: Review of systems not obtained due to patient factors.  Past Medical History  Diagnosis Date  . Diabetes mellitus without complication   . Hypertension     History  Substance Use Topics  . Smoking status: Never Smoker   . Smokeless tobacco: Not on file  . Alcohol Use: No    Family History  Problem Relation Age of Onset  . Lung cancer Father    No Known Allergies  OBJECTIVE: Blood pressure 127/64, pulse 85, temperature 97.5 F (36.4 C), temperature source Oral, resp. rate 19, height 5' (1.524 m), weight 64.8 kg (142 lb 13.7 oz), SpO2 100.00%. General: She is babbling speech. She does not open her eyes to voice or answer questions Skin: Multiple erythematous skin lesions (records indicate a history of basal cell carcinoma) Lungs: Clear Cor: Distant but regular S1 and S2 with no murmurs heard Abdomen: Soft and not obviously tender No swelling redness or other signs of infection over right hip  Lab Results Lab Results  Component Value Date   WBC 18.5* 03/28/2013   HGB 11.2* 03/28/2013   HCT 34.6* 03/28/2013   MCV 86.9 03/28/2013  PLT 181 03/28/2013    Lab Results  Component Value Date   CREATININE 1.73* 03/28/2013   BUN 52* 03/28/2013   NA 158* 03/28/2013   K 4.3 03/28/2013   CL 125* 03/28/2013   CO2 20 03/28/2013    Lab Results  Component Value Date   ALT 241* 03/28/2013   AST 127* 03/28/2013   ALKPHOS 118* 03/28/2013   BILITOT 0.4 03/28/2013     Microbiology: Recent Results (from the past 240 hour(s))  CULTURE, BLOOD (ROUTINE X 2)     Status: None   Collection Time    03/26/13 12:01 PM      Result Value Ref Range Status   Specimen Description BLOOD LEFT ANTECUBITAL    Final   Special Requests BOTTLES DRAWN AEROBIC ONLY 5CC   Final   Culture  Setup Time     Final   Value: 03/26/2013 16:11     Performed at [facility]   Culture     Final   Value: METHICILLIN RESISTANT STAPHYLOCOCCUS AUREUS     Note: RIFAMPIN AND GENTAMICIN SHOULD NOT BE USED AS SINGLE DRUGS FOR TREATMENT OF STAPH INFECTIONS.     Note: Gram Stain Report Called to,Read Back By and Verified With: [facility]ARYBETH HANCOCK 03/27/13 9:00PM THOMI     Performed at [facility]   Report Status PENDING   Incomplete  URINE CULTURE     Status: None   Collection Time    03/26/13 12:25 PM      Result Value Ref Range Status   Specimen Description URINE, CATHETERIZED   Final   Special Requests NONE   Final   Culture  Setup Time     Final   Value: 03/26/2013 13:46     Performed at [facility]   Colony Count     Final   Value: NO GROWTH     Performed at [facility]   Culture     Final   Value: NO GROWTH     Performed at [facility]   Report Status 03/27/2013 FINAL   Final  CLOSTRIDIUM DIFFICILE BY PCR     Status: None   Collection Time    03/26/13  4:03 PM      Result Value Ref Range Status   C difficile by pcr NEGATIVE  NEGATIVE Final  MRSA PCR SCREENING     Status: Abnormal   Collection Time    03/26/13  8:02 PM      Result Value Ref Range Status   MRSA by PCR POSITIVE (*) NEGATIVE Final   Comment:            The GeneXpert MRSA Assay (FDA     approved for NASAL specimens     only), is one component of a     comprehensive MRSA colonization     surveillance program. It is not     intended to diagnose MRSA     infection nor to guide or     monitor treatment for     MRSA infections.     RESULT CALLED TO, READ BACK BY AND VERIFIED WITH:     Bridget HartshornM PIEL RN [id number] 03/26/13 A BROWNING  CULTURE, BLOOD (ROUTINE X 2)     Status: None   Collection Time    03/26/13  8:35 PM      Result Value Ref Range Status   Specimen Description BLOOD LEFT ARM   Final   Special  Requests BOTTLES DRAWN AEROBIC  ONLY 2CC   Final   Culture  Setup Time     Final   Value: 03/27/2013 01:17     Performed at [facility]   Culture     Final   Value:        BLOOD CULTURE RECEIVED NO GROWTH TO DATE CULTURE WILL BE HELD FOR 5 DAYS BEFORE ISSUING A FINAL NEGATIVE REPORT     Performed at [facility]   Report Status PENDING   Incomplete    Cliffton Asters, MD [facility] [phone] pager   [phone] cell 03/28/2013, 3:03 PM

## 2013-03-28 NOTE — Progress Notes (Signed)
Subjective: Ms. Clouse had was seen and examined this morning.  She opened eyes to voice.  Shook her head "no" when asked if she was experiencing pain.    Objective: Vital signs in last 24 hours: Filed Vitals:   03/28/13 0011 03/28/13 0400 03/28/13 0935 03/28/13 1238  BP: 92/76 94/75 111/97 127/64  Pulse: 106 94 84 85  Temp: 97.4 F (36.3 C) 97.9 F (36.6 C) 98 F (36.7 C) 97.5 F (36.4 C)  TempSrc: Axillary Axillary Oral Oral  Resp: 16 23 15 19   Height:      Weight:  64.8 kg (142 lb 13.7 oz)    SpO2: 95% 100% 100% 100%   Weight change: 4.3 kg (9 lb 7.7 oz)  Intake/Output Summary (Last 24 hours) at 03/28/13 1240 Last data filed at 03/28/13 1033  Gross per 24 hour  Intake 3712.5 ml  Output   1000 ml  Net 2712.5 ml   General: resting in bed in NAD HEENT:  Dry mucous membranes Cardiac: RRR, no rubs, murmurs or gallops Pulm: clear to auscultation bilaterally, moving normal volumes of air Abd: soft, nontender, nondistended Ext: SCDs in place, no pedal edema, 2+ DPs Neuro: awake and alert, opening eyes on command  Lab Results: Basic Metabolic Panel:  Recent Labs Lab 03/26/13 1725 03/27/13  03/27/13 1957 03/28/13 0219  NA 156* 159*  < > 153* 158*  K 3.9 3.4*  < > 4.5 4.3  CL 115* 119*  < > 119* 125*  CO2 17* 20  < > 17* 20  GLUCOSE 233* 154*  < > 121* 107*  BUN 60* 67*  < > 58* 52*  CREATININE 1.95* 2.08*  < > 1.79* 1.73*  CALCIUM 7.9* 7.9*  < > 8.0* 8.3*  MG  --  3.1*  --   --   --   < > = values in this interval not displayed. Liver Function Tests:  Recent Labs Lab 03/27/13 1957 03/28/13 0219  AST 177* 127*  ALT 288* 241*  ALKPHOS 119* 118*  BILITOT 0.4 0.4  PROT 4.9* 5.0*  ALBUMIN 1.7* 1.7*   CBC:  Recent Labs Lab 03/26/13 1200  03/27/13 0542 03/28/13 0219  WBC 31.7*  < > 21.7* 18.5*  NEUTROABS 28.8*  --   --   --   HGB 14.3  < > 12.0 11.2*  HCT 44.6  < > 36.6 34.6*  MCV 89.4  < > 86.1 86.9  PLT 201  < > 182 181  < > = values in this  interval not displayed.  CBG:  Recent Labs Lab 03/27/13 1251 03/27/13 1640 03/27/13 2002 03/28/13 0010 03/28/13 0510 03/28/13 0836  GLUCAP 98 104* 101* 102* 91 86   Medications: I have reviewed the patient's current medications. Scheduled Meds: . antiseptic oral rinse  15 mL Mouth Rinse BID  . chlorhexidine  15 mL Mouth Rinse BID  . insulin aspart  0-9 Units Subcutaneous 6 times per day  . LORazepam  0.25 mg Intravenous Once  . pantoprazole (PROTONIX) IV  40 mg Intravenous Q12H  . piperacillin-tazobactam (ZOSYN)  IV  2.25 g Intravenous 3 times per day  . sodium chloride  1,000 mL Intravenous Once  . sodium chloride  10-40 mL Intracatheter Q12H  . sodium chloride  3 mL Intravenous Q12H  . vancomycin  750 mg Intravenous Q48H   Continuous Infusions: . dextrose     Assessment/Plan: 80 year old woman with DM type 2, basal cell carcinoma, right intertrochanteric femur fracture (  s/p repair Dec 2014) who presents with AMS and hypotension.   Hypernatremia: Na 156-->158-->153-->158 likely 2/2 to dehydration. Son reports poor po for past month.  Also with N/V, diarrhea.  This has all likely led to her hypovolemia and presentation with hypotension, pre-renal azotemia and ischemic hepatitis.  Free water deficit is ~ 3.5L. - continue hydration:  LR --> D5-water to address free water deficit   Severe sepsis: Tachycardia, tachypneic, WBC 31.7, Evidence of hypoperfusion: Hypotension, lactic acid 14.25, creatinine, liver enzymes and troponin elevated.  Hypotension responding to IV fluids.  Lactic acid this AM, 14.25--> 2.0 and urine culture negative.  1 of 2 blood culture bottles growing gram + cocci in clusters.   - continue hydration:  LR --> D5-water to address free water deficit - continue empiric antibiotics - Vancomycin and Zosyn per pharmacy - Zofran prn - PICC insertion because patient has poor peripheral venous access - CMP and CBC q12h  GI bleed: FOBT + and two episodes of diarrhea  in the ED (one with gross blood per nurse). Dark (but not coffee ground) emesis.  Several dark BMs last night.  Hgb 14.3--> 12.0 after IV fluids. GI consulted and recommendations appreciated.  Source of bleeding unclear but does not appear severe.  Deferring endoscopy until other medical issues resolved and only if continued bleeding.  Stool pathogen panel negative.  - IV protonix  - trial of clear liquids after SLP evaluation  Positive troponin: Trop 4.37--> 10.17--> 9.22. Likely 2/2 to demand ischemia. Spoke with cardiology and given GI bleed not a candidate for heparin or ASA.   DM type 2: Hgb A1c 7.04 Jan 2013. Home medication is metformin.  Hgb stable 95-150 on SSI sensitive.  - continue SSI sensitive   Diet:  Appreciate SLP eval.  Pt NPO except for meds with puree.  SLP following.  VTE ppx: SCDs only in the setting of GI bleed  Dispo: Disposition is deferred at this time, awaiting improvement of current medical problems.  Anticipated discharge in approximately 1-2 day(s).   The patient does not have a current PCP (No Pcp Per Patient) and does not [facility] after discharge.  The patient does not know have transportation limitations that hinder transportation to clinic appointments.  .Services Needed at time of discharge: Y = Yes, Blank = No PT:   OT:   RN:   Equipment:   Other:     LOS: 2 days   Evelena PeatAlex Wilson, DO 03/28/2013, 12:40 PM

## 2013-03-28 NOTE — Progress Notes (Signed)
Restless and pulling restraints causing trauma and skin tears to arms. Restraints removed; decrease in anxiety noted. MD made aware on rounds.

## 2013-03-28 NOTE — Progress Notes (Signed)
Peripherally Inserted Central Catheter/Midline Placement  The IV Nurse has discussed with the patient and/or persons authorized to consent for the patient, the purpose of this procedure and the potential benefits and risks involved with this procedure.  The benefits include less needle sticks, lab draws from the catheter and patient may be discharged home with the catheter.  Risks include, but not limited to, infection, bleeding, blood clot (thrombus formation), and puncture of an artery; nerve damage and irregular heat beat.  Alternatives to this procedure were also discussed.  PICC/Midline Placement Documentation        Lisabeth DevoidGibbs, Zymire Turnbo Jeanette 03/28/2013, 11:10 AM Consent obtained via phone by Merleen Millinerhea Duncan, RN, CRNI from son Melven Sartoriusnthony Leinberger

## 2013-03-28 NOTE — Progress Notes (Signed)
Internal Medicine Attending  Date: 03/28/2013  Patient name: Kaitlin CottonJosephine Shahin Medical record number: [id number] Date of birth: Dec 30, 1921 Age: 80 y.o. Gender: female  I saw and evaluated the patient. I reviewed the resident's note by Dr. Andrey CampanileWilson and I agree with the resident's findings and plans as documented in her progress note.  [name] was more somnolent when seen on rounds this AM.  Although blood pressure is improved with LR hydration she remains hypernatremic with a 3.5 liter free water deficit.  One of two blood cultures is growing MRSA and ID has recommended continued therapy with IV Vancomycin.  Thus, her initial presentation is now more in line with MRSA bacteremia with sepsis and concomitant hypovolemia.  I am concerned that some of her mental status issues are related to the hypernatremia and free water deficit in addition to her MRSA sepsis and hypovolemia.  We will therefore work on free water replacement with IV D5W and continue the Vancomycin.  If we feel she remains mildly hypovolemic after we have corrected her free water deficit we will restart the LR if appropriate.

## 2013-03-28 NOTE — Evaluation (Signed)
Clinical/Bedside Swallow Evaluation Patient Details  Name: Kaitlin Brennan MRN: [id number] Date of Birth: 05-11-1921  Today's Date: 03/28/2013 Time: 0820-0840 SLP Time Calculation (min): 20 min  Past Medical History:  Past Medical History  Diagnosis Date  . Diabetes mellitus without complication   . Hypertension    Past Surgical History:  Past Surgical History  Procedure Laterality Date  . Intramedullary (im) nail intertrochanteric Right 01/02/2013    Procedure: INTRAMEDULLARY (IM) NAIL INTERTROCHANTRIC RIGHT HIP;  Surgeon: Cheral AlmasNaiping Michael Xu, MD;  Location: [facility] OR;  Service: Orthopedics;  Laterality: Right;   HPI:  Pt is an 80 year old female,  had repair of hip fracture 12/14 admitted from SNF with decreased oral intake and altered minimal status.  She had been on antibiotics for UTI recently by history. There is concern for GI bleed per GI notes. Recommended clear liquid diet.    Assessment / Plan / Recommendation Clinical Impression  Pt presents with significant confusion but is awake. She was able to to accept and transit teaspoon of puree but was not aware of straw or cup for safe presentation of liquids. Pt may take meds crushed in puree but should otherwise remain NPO until mentation improves. Expect that swallow function will be satisfactory with appropriate awareness of PO. SLP will continue to follow tomorrow to check for readiness.     Aspiration Risk  Moderate    Diet Recommendation NPO except meds   Medication Administration: Crushed with puree Compensations: Slow rate;Small sips/bites Postural Changes and/or Swallow Maneuvers: Seated upright 90 degrees    Other  Recommendations Oral Care Recommendations: Oral care Q4 per protocol Other Recommendations: Have oral suction available   Follow Up Recommendations  Skilled Nursing facility    Frequency and Duration min 2x/week      Pertinent Vitals/Pain NA    SLP Swallow Goals     Swallow Study Prior  Functional Status       General HPI: Pt is an 80 year old female,  had repair of hip fracture 12/14 admitted from SNF with decreased oral intake and altered minimal status.  She had been on antibiotics for UTI recently by history. There is concern for GI bleed per GI notes. Recommended clear liquid diet.  Type of Study: Bedside swallow evaluation Diet Prior to this Study: NPO Temperature Spikes Noted: No Respiratory Status: Nasal cannula History of Recent Intubation: No Behavior/Cognition: Confused Oral Cavity - Dentition: Adequate natural dentition Self-Feeding Abilities: Total assist Patient Positioning: Upright in bed Baseline Vocal Quality: Clear Volitional Cough: Strong Volitional Swallow: Unable to elicit    Oral/Motor/Sensory Function Overall Oral Motor/Sensory Function: Appears within functional limits for tasks assessed (does not follow commands, but no focal weakness)   Ice Chips Ice chips: Impaired Presentation: Spoon Oral Phase Impairments: Poor awareness of bolus Oral Phase Functional Implications: Right anterior spillage;Left anterior spillage;Prolonged oral transit   Thin Liquid Thin Liquid: Impaired Presentation: Straw Oral Phase Impairments: Reduced labial seal;Poor awareness of bolus Oral Phase Functional Implications: Prolonged oral transit Pharyngeal  Phase Impairments: Cough - Immediate    Nectar Thick Nectar Thick Liquid: Not tested   Honey Thick Honey Thick Liquid: Not tested   Puree Puree: Impaired Presentation: Spoon Oral Phase Impairments: Poor awareness of bolus   Solid   GO    Solid: Not tested      Harlon DittyBonnie Ammi Hutt, MA CCC-SLP [phone]  Claudine MoutonDeBlois, Maciej Schweitzer Caroline 03/28/2013,9:06 AM

## 2013-03-28 NOTE — Progress Notes (Signed)
[facility] PROGRESS NOTE Subjective no gross bleeding. Patient unable to given history.  Objective: Vital signs in last 24 hours: Temp:  [97.4 F (36.3 C)-98.2 F (36.8 C)] 97.9 F (36.6 C) (02/27 0400) Pulse Rate:  [85-106] 94 (02/27 0400) Resp:  [16-24] 23 (02/27 0400) BP: (76-105)/(36-76) 94/75 mmHg (02/27 0400) SpO2:  [95 %-100 %] 100 % (02/27 0400) Weight:  [64.8 kg (142 lb 13.7 oz)] 64.8 kg (142 lb 13.7 oz) (02/27 0400) Last BM Date: 03/27/13  Intake/Output from previous day: 02/26 0701 - 02/27 0700 In: 3712.5 [I.V.:2412.5; IV Piggyback:1300] Out: 1100 [Urine:1100] Intake/Output this shift:    PE: General-- patient tied in bed and moaning. Completely confused. Heart-- Lungs-- Abdomen-- none distended soft and nontender  Lab Results:  Recent Labs  03/26/13 1200 03/26/13 2028 03/27/13 03/27/13 0542 03/28/13 0219  WBC 31.7* 30.2* 24.4* 21.7* 18.5*  HGB 14.3 13.2 12.3 12.0 11.2*  HCT 44.6 40.7 38.5 36.6 34.6*  PLT 201 217 200 182 181   BMET  Recent Labs  03/26/13 1725 03/27/13 03/27/13 0542 03/27/13 1957 03/28/13 0219  NA 156* 159* 158* 153* 158*  K 3.9 3.4* 3.3* 4.5 4.3  CL 115* 119* 122* 119* 125*  CO2 17* 20 17* 17* 20  CREATININE 1.95* 2.08* 2.01* 1.79* 1.73*   LFT  Recent Labs  03/27/13 0542 03/27/13 1957 03/28/13 0219  PROT 5.0* 4.9* 5.0*  AST 321* 177* 127*  ALT 376* 288* 241*  ALKPHOS 122* 119* 118*  BILITOT 0.4 0.4 0.4   PT/INR No results found for this basename: LABPROT, INR,  in the last 72 hours PANCREAS No results found for this basename: LIPASE,  in the last 72 hours       Studies/Results: Dg Chest Port 1 View  03/26/2013   CLINICAL DATA:  Shortness of breath and weakness  EXAM: PORTABLE CHEST - 1 VIEW  COMPARISON:  01/02/2013  FINDINGS: Fine and coarse interstitial opacities diffusely. Lung volumes appear decreased. No cardiomegaly. Limited assessment of the hila and upper mediastinal contours due to  rightward rotation. No evidence of acute superimposed disease, including edema or consolidation. No effusion or pneumothorax.  IMPRESSION: Interstitial lung disease without evidence of acute superimposed process.   Electronically Signed   By: Tiburcio PeaJonathan  Watts M.D.   On: 03/26/2013 13:27    Medications: I have reviewed the patient's current medications.  Assessment/Plan: 1. G.I. bleeding. Hemoglobin is stable. Not sure if this is significant or not. Currently there is no sign of active severe bleeding and she does have multiple other problems. 2. Probable sepsis. Patient on broad spectrum antibiotics source is still unclear 3. Altered mental status  Dr. Madilyn FiremanHayes will see this weekend. No need for EGD at this point would continue PPI therapy  Tyeson Tanimoto JR,Jettie Mannor L 03/28/2013, 10:00 AM

## 2013-03-28 NOTE — Progress Notes (Signed)
PT Cancellation Note  Patient Details Name: Danise MinaJosephine Arline MRN: [id number] DOB: 03-Jul-1921   Cancelled Treatment:    Reason Eval/Treat Not Completed: Other (comment) (pt on bedrest)   [name], Chapin Arduini [facility] 03/28/2013, 7:24 AM [name] Claryssa Sandner, PT [phone]

## 2013-03-29 DIAGNOSIS — A4102 Sepsis due to Methicillin resistant Staphylococcus aureus: Principal | ICD-10-CM

## 2013-03-29 DIAGNOSIS — R4182 Altered mental status, unspecified: Secondary | ICD-10-CM

## 2013-03-29 LAB — GLUCOSE, CAPILLARY
GLUCOSE-CAPILLARY: 78 mg/dL (ref 70–99)
GLUCOSE-CAPILLARY: 112 mg/dL — AB (ref 70–99)
GLUCOSE-CAPILLARY: 99 mg/dL (ref 70–99)
Glucose-Capillary: 136 mg/dL — ABNORMAL HIGH (ref 70–99)
Glucose-Capillary: 133 mg/dL — ABNORMAL HIGH (ref 70–99)
Glucose-Capillary: 123 mg/dL — ABNORMAL HIGH (ref 70–99)

## 2013-03-29 LAB — COMPREHENSIVE METABOLIC PANEL
ALK PHOS: 301 U/L — AB (ref 39–117)
ALT: 130 U/L — AB (ref 0–35)
ALT: 119 U/L — ABNORMAL HIGH (ref 0–35)
AST: 53 U/L — AB (ref 0–37)
AST: 44 U/L — AB (ref 0–37)
Albumin: 1.5 g/dL — ABNORMAL LOW (ref 3.5–5.2)
Albumin: 1.7 g/dL — ABNORMAL LOW (ref 3.5–5.2)
Alkaline Phosphatase: 307 U/L — ABNORMAL HIGH (ref 39–117)
BILIRUBIN TOTAL: 0.4 mg/dL (ref 0.3–1.2)
BUN: 29 mg/dL — ABNORMAL HIGH (ref 6–23)
BUN: 22 mg/dL (ref 6–23)
CALCIUM: 7.7 mg/dL — AB (ref 8.4–10.5)
CHLORIDE: 122 meq/L — AB (ref 96–112)
CO2: 25 meq/L (ref 19–32)
CO2: 25 meq/L (ref 19–32)
Calcium: 7.9 mg/dL — ABNORMAL LOW (ref 8.4–10.5)
Chloride: 120 mEq/L — ABNORMAL HIGH (ref 96–112)
Creatinine, Ser: 1.26 mg/dL — ABNORMAL HIGH (ref 0.50–1.10)
Creatinine, Ser: 1.09 mg/dL (ref 0.50–1.10)
GFR calc non Af Amer: 44 mL/min — ABNORMAL LOW (ref >90)
GFR, EST AFRICAN AMERICAN: 42 mL/min — AB (ref >90)
GFR, EST AFRICAN AMERICAN: 51 mL/min — AB (ref >90)
GFR, EST NON AFRICAN AMERICAN: 37 mL/min — AB (ref >90)
GLUCOSE: 108 mg/dL — AB (ref 70–99)
Glucose, Bld: 125 mg/dL — ABNORMAL HIGH (ref 70–99)
POTASSIUM: 3.5 meq/L — AB (ref 3.7–5.3)
Potassium: 3.6 mEq/L — ABNORMAL LOW (ref 3.7–5.3)
SODIUM: 155 meq/L — AB (ref 137–147)
Sodium: 155 mEq/L — ABNORMAL HIGH (ref 137–147)
Total Bilirubin: 0.4 mg/dL (ref 0.3–1.2)
Total Protein: 4.3 g/dL — ABNORMAL LOW (ref 6.0–8.3)
Total Protein: 4.4 g/dL — ABNORMAL LOW (ref 6.0–8.3)

## 2013-03-29 LAB — CULTURE, BLOOD (ROUTINE X 2)

## 2013-03-29 LAB — CBC
HCT: 29.2 % — ABNORMAL LOW (ref 36.0–46.0)
HCT: 30.1 % — ABNORMAL LOW (ref 36.0–46.0)
HEMOGLOBIN: 9.4 g/dL — AB (ref 12.0–15.0)
Hemoglobin: 9.7 g/dL — ABNORMAL LOW (ref 12.0–15.0)
MCH: 27.7 pg (ref 26.0–34.0)
MCH: 27.8 pg (ref 26.0–34.0)
MCHC: 32.2 g/dL (ref 30.0–36.0)
MCHC: 32.2 g/dL (ref 30.0–36.0)
MCV: 86.1 fL (ref 78.0–100.0)
MCV: 86.2 fL (ref 78.0–100.0)
PLATELETS: 170 10*3/uL (ref 150–400)
PLATELETS: 160 10*3/uL (ref 150–400)
RBC: 3.39 MIL/uL — AB (ref 3.87–5.11)
RBC: 3.49 MIL/uL — ABNORMAL LOW (ref 3.87–5.11)
RDW: 19.9 % — ABNORMAL HIGH (ref 11.5–15.5)
RDW: 20 % — ABNORMAL HIGH (ref 11.5–15.5)
WBC: 15.1 10*3/uL — AB (ref 4.0–10.5)
WBC: 15.2 10*3/uL — ABNORMAL HIGH (ref 4.0–10.5)

## 2013-03-29 MED ORDER — DEXTROSE-NACL 5-0.45 % IV SOLN
INTRAVENOUS | Status: DC
  Administered 2013-03-29 – 2013-03-30 (×3): via INTRAVENOUS

## 2013-03-29 NOTE — Progress Notes (Signed)
Recieved order for in and out cath

## 2013-03-29 NOTE — Progress Notes (Signed)
[facility] Progress Note  Subjective: Agitated, restrained, no meaningful verbal responses  Objective: Vital signs in last 24 hours: Temp:  [97.9 F (36.6 C)-98.5 F (36.9 C)] 98.2 F (36.8 C) (02/28 1228) Pulse Rate:  [68-100] 89 (02/28 1228) Resp:  [13-25] 13 (02/28 1228) BP: (102-124)/(41-95) 115/95 mmHg (02/28 1228) SpO2:  [90 %-100 %] 90 % (02/28 1228) Weight change:    PE: Abdomen soft did not appear tender  Lab Results: Results for orders placed during the hospital encounter of 03/26/13 (from the past 24 hour(s))  BASIC METABOLIC PANEL     Status: Abnormal   Collection Time    03/28/13  3:13 PM      Result Value Ref Range   Sodium 144  137 - 147 mEq/L   Potassium 3.5 (*) 3.7 - 5.3 mEq/L   Chloride 111  96 - 112 mEq/L   CO2 21  19 - 32 mEq/L   Glucose, Bld 421 (*) 70 - 99 mg/dL   BUN 39 (*) 6 - 23 mg/dL   Creatinine, Ser 8.651.41 (*) 0.50 - 1.10 mg/dL   Calcium 7.6 (*) 8.4 - 10.5 mg/dL   GFR calc non Af Amer 32 (*) >90 mL/min   GFR calc Af Amer 37 (*) >90 mL/min  GLUCOSE, CAPILLARY     Status: Abnormal   Collection Time    03/28/13  5:21 PM      Result Value Ref Range   Glucose-Capillary 106 (*) 70 - 99 mg/dL  CBC     Status: Abnormal   Collection Time    03/28/13  6:00 PM      Result Value Ref Range   WBC 16.3 (*) 4.0 - 10.5 K/uL   RBC 3.83 (*) 3.87 - 5.11 MIL/uL   Hemoglobin 10.6 (*) 12.0 - 15.0 g/dL   HCT 78.433.6 (*) 69.636.0 - 29.546.0 %   MCV 87.7  78.0 - 100.0 fL   MCH 27.7  26.0 - 34.0 pg   MCHC 31.5  30.0 - 36.0 g/dL   RDW 28.420.1 (*) 13.211.5 - 44.015.5 %   Platelets 170  150 - 400 K/uL  COMPREHENSIVE METABOLIC PANEL     Status: Abnormal   Collection Time    03/28/13  6:00 PM      Result Value Ref Range   Sodium 159 (*) 137 - 147 mEq/L   Potassium 3.5 (*) 3.7 - 5.3 mEq/L   Chloride 125 (*) 96 - 112 mEq/L   CO2 23  19 - 32 mEq/L   Glucose, Bld 114 (*) 70 - 99 mg/dL   BUN 40 (*) 6 - 23 mg/dL   Creatinine, Ser 1.021.55 (*) 0.50 - 1.10 mg/dL   Calcium 7.9 (*)  8.4 - 10.5 mg/dL   Total Protein 4.5 (*) 6.0 - 8.3 g/dL   Albumin 1.5 (*) 3.5 - 5.2 g/dL   AST 68 (*) 0 - 37 U/L   ALT 170 (*) 0 - 35 U/L   Alkaline Phosphatase 100  39 - 117 U/L   Total Bilirubin 0.4  0.3 - 1.2 mg/dL   GFR calc non Af Amer 29 (*) >90 mL/min   GFR calc Af Amer 33 (*) >90 mL/min  GLUCOSE, CAPILLARY     Status: Abnormal   Collection Time    03/28/13  8:08 PM      Result Value Ref Range   Glucose-Capillary 102 (*) 70 - 99 mg/dL  GLUCOSE, CAPILLARY     Status: Abnormal   Collection Time  03/29/13  1:27 AM      Result Value Ref Range   Glucose-Capillary 136 (*) 70 - 99 mg/dL  GLUCOSE, CAPILLARY     Status: None   Collection Time    03/29/13  4:33 AM      Result Value Ref Range   Glucose-Capillary 78  70 - 99 mg/dL  CBC     Status: Abnormal   Collection Time    03/29/13  6:00 AM      Result Value Ref Range   WBC 15.1 (*) 4.0 - 10.5 K/uL   RBC 3.39 (*) 3.87 - 5.11 MIL/uL   Hemoglobin 9.4 (*) 12.0 - 15.0 g/dL   HCT 74.2 (*) 59.5 - 63.8 %   MCV 86.1  78.0 - 100.0 fL   MCH 27.7  26.0 - 34.0 pg   MCHC 32.2  30.0 - 36.0 g/dL   RDW 75.6 (*) 43.3 - 29.5 %   Platelets 170  150 - 400 K/uL  COMPREHENSIVE METABOLIC PANEL     Status: Abnormal   Collection Time    03/29/13  6:00 AM      Result Value Ref Range   Sodium 155 (*) 137 - 147 mEq/L   Potassium 3.5 (*) 3.7 - 5.3 mEq/L   Chloride 122 (*) 96 - 112 mEq/L   CO2 25  19 - 32 mEq/L   Glucose, Bld 108 (*) 70 - 99 mg/dL   BUN 29 (*) 6 - 23 mg/dL   Creatinine, Ser 1.88 (*) 0.50 - 1.10 mg/dL   Calcium 7.9 (*) 8.4 - 10.5 mg/dL   Total Protein 4.3 (*) 6.0 - 8.3 g/dL   Albumin 1.5 (*) 3.5 - 5.2 g/dL   AST 53 (*) 0 - 37 U/L   ALT 130 (*) 0 - 35 U/L   Alkaline Phosphatase 301 (*) 39 - 117 U/L   Total Bilirubin 0.4  0.3 - 1.2 mg/dL   GFR calc non Af Amer 37 (*) >90 mL/min   GFR calc Af Amer 42 (*) >90 mL/min  GLUCOSE, CAPILLARY     Status: Abnormal   Collection Time    03/29/13  8:02 AM      Result Value Ref Range    Glucose-Capillary 112 (*) 70 - 99 mg/dL  GLUCOSE, CAPILLARY     Status: Abnormal   Collection Time    03/29/13 12:31 PM      Result Value Ref Range   Glucose-Capillary 133 (*) 70 - 99 mg/dL    Studies/Results: No results found.    Assessment: Sepsis hypovolemia, lactic acidosis Some evidence of GI bleeding, non-destabilizing.   Plan: Would prefer to manage bleeding empirically unless becomes severe and to consider EGD if ongoing bleeding apparent. There is probably some delutional effect or drop in hemoglobin given her hypervolemia on admission. Would treat with IV Protonix and will continue to follow for possible eventual endoscopy if needed, hopefully when she is more stable and less agitated.    Jerzi Tigert C 03/29/2013, 1:51 PM

## 2013-03-29 NOTE — Progress Notes (Signed)
Subjective: She opens her eyes when I call her name but does not follow commands. She is unable to tell me if she is in pain but does not appear in distress.   Objective: Vital signs in last 24 hours: Filed Vitals:   03/29/13 0630 03/29/13 0645 03/29/13 0756 03/29/13 0800  BP: 111/70  102/51 102/51  Pulse: 68 87 83 84  Temp:   98.5 F (36.9 C)   TempSrc:   Axillary   Resp: 19 19 18 19   Height:      Weight:      SpO2:  100% 100% 100%   Weight change:   Intake/Output Summary (Last 24 hours) at 03/29/13 1035 Last data filed at 03/29/13 1000  Gross per 24 hour  Intake 1765.83 ml  Output   1275 ml  Net 490.83 ml   General: resting in bed in NAD  HEENT: Moist mucus membranes, cancerous ulcerated lesion noted on her left neck infraauricular Cardiac: RRR, no rubs, murmurs or gallops  Pulm: clear to auscultation bilaterally, moving normal volumes of air  Abd: soft, nontender, nondistended  Ext: SCDs in place, no pedal edema, 2+ DPs. Forearms covered with soft bandages, scattered small erythematous skin lesions in upper arm and face (per records patient has history of basal cell carcinoma) Neuro: awake and alert, opening eyes on command   Lab Results: Basic Metabolic Panel:  Recent Labs Lab 03/26/13 1725 03/27/13  03/28/13 1800 03/29/13 0600  NA 156* 159*  < > 159* 155*  K 3.9 3.4*  < > 3.5* 3.5*  CL 115* 119*  < > 125* 122*  CO2 17* 20  < > 23 25  GLUCOSE 233* 154*  < > 114* 108*  BUN 60* 67*  < > 40* 29*  CREATININE 1.95* 2.08*  < > 1.55* 1.26*  CALCIUM 7.9* 7.9*  < > 7.9* 7.9*  MG  --  3.1*  --   --   --   < > = values in this interval not displayed. Liver Function Tests:  Recent Labs Lab 03/28/13 1800 03/29/13 0600  AST 68* 53*  ALT 170* 130*  ALKPHOS 100 301*  BILITOT 0.4 0.4  PROT 4.5* 4.3*  ALBUMIN 1.5* 1.5*   CBC:  Recent Labs Lab 03/26/13 1200  03/28/13 1800 03/29/13 0600  WBC 31.7*  < > 16.3* 15.1*  NEUTROABS 28.8*  --   --   --   HGB  14.3  < > 10.6* 9.4*  HCT 44.6  < > 33.6* 29.2*  MCV 89.4  < > 87.7 86.1  PLT 201  < > 170 170  < > = values in this interval not displayed. Cardiac Enzymes:  Recent Labs Lab 03/26/13 1725 03/26/13 2348 03/27/13 0542  TROPONINI 4.37* 10.17* 9.22*   CBG:  Recent Labs Lab 03/28/13 1240 03/28/13 1721 03/28/13 2008 03/29/13 0127 03/29/13 0433 03/29/13 0802  GLUCAP 85 106* 102* 136* 78 112*   Hemoglobin A1C:  Recent Labs Lab 03/26/13 2028  HGBA1C 5.4   Urinalysis:  Recent Labs Lab 03/26/13 1225  COLORURINE AMBER*  LABSPEC 1.024  PHURINE 5.0  GLUCOSEU 100*  HGBUR NEGATIVE  BILIRUBINUR MODERATE*  KETONESUR 15*  PROTEINUR NEGATIVE  UROBILINOGEN 0.2  NITRITE NEGATIVE  LEUKOCYTESUR SMALL*   Micro Results: Recent Results (from the past 240 hour(s))  CULTURE, BLOOD (ROUTINE X 2)     Status: None   Collection Time    03/26/13 12:01 PM      Result Value Ref Range  Status   Specimen Description BLOOD LEFT ANTECUBITAL   Final   Special Requests BOTTLES DRAWN AEROBIC ONLY 5CC   Final   Culture  Setup Time     Final   Value: 03/26/2013 16:11     Performed at [facility]   Culture     Final   Value: METHICILLIN RESISTANT STAPHYLOCOCCUS AUREUS     Note: RIFAMPIN AND GENTAMICIN SHOULD NOT BE USED AS SINGLE DRUGS FOR TREATMENT OF STAPH INFECTIONS. This organism DOES NOT demonstrate inducible Clindamycin resistance in vitro.     Note: Gram Stain Report Called to,Read Back By and Verified With: [facility] HANCOCK 03/27/13 9:00PM THOMI     Performed at [facility]   Report Status 03/29/2013 FINAL   Final   Organism ID, Bacteria METHICILLIN RESISTANT STAPHYLOCOCCUS AUREUS   Final  URINE CULTURE     Status: None   Collection Time    03/26/13 12:25 PM      Result Value Ref Range Status   Specimen Description URINE, CATHETERIZED   Final   Special Requests NONE   Final   Culture  Setup Time     Final   Value: 03/26/2013 13:46     Performed at [facility] Count     Final   Value: NO GROWTH     Performed at [facility]   Culture     Final   Value: NO GROWTH     Performed at [facility]   Report Status 03/27/2013 FINAL   Final  CLOSTRIDIUM DIFFICILE BY PCR     Status: None   Collection Time    03/26/13  4:03 PM      Result Value Ref Range Status   C difficile by pcr NEGATIVE  NEGATIVE Final  MRSA PCR SCREENING     Status: Abnormal   Collection Time    03/26/13  8:02 PM      Result Value Ref Range Status   MRSA by PCR POSITIVE (*) NEGATIVE Final   Comment:            The GeneXpert MRSA Assay (FDA     approved for NASAL specimens     only), is one component of a     comprehensive MRSA colonization     surveillance program. It is not     intended to diagnose MRSA     infection nor to guide or     monitor treatment for     MRSA infections.     RESULT CALLED TO, READ BACK BY AND VERIFIED WITH:     Bridget Hartshorn RN 2154 03/26/13 A BROWNING  CULTURE, BLOOD (ROUTINE X 2)     Status: None   Collection Time    03/26/13  8:35 PM      Result Value Ref Range Status   Specimen Description BLOOD LEFT ARM   Final   Special Requests BOTTLES DRAWN AEROBIC ONLY 2CC   Final   Culture  Setup Time     Final   Value: 03/27/2013 01:17     Performed at [facility]   Culture     Final   Value:        BLOOD CULTURE RECEIVED NO GROWTH TO DATE CULTURE WILL BE HELD FOR 5 DAYS BEFORE ISSUING A FINAL NEGATIVE REPORT     Performed at [facility]   Report Status PENDING   Incomplete   Studies/Results: No results found. Medications:  I have reviewed the patient's current medications. Scheduled Meds: . antiseptic oral rinse  15 mL Mouth Rinse BID  . chlorhexidine  15 mL Mouth Rinse BID  . insulin aspart  0-9 Units Subcutaneous 6 times per day  . LORazepam  0.25 mg Intravenous Once  . pantoprazole (PROTONIX) IV  40 mg Intravenous Q12H  . sodium chloride  1,000 mL Intravenous Once  . sodium chloride  10-40 mL  Intracatheter Q12H  . sodium chloride  3 mL Intravenous Q12H  . vancomycin  750 mg Intravenous Q48H   Continuous Infusions: . dextrose 5 % and 0.45% NaCl     PRN Meds:.morphine injection, sodium chloride Assessment/Plan: 80 year old woman with DM22, basal cell carcinoma, right intertrochanteric femur fracture (s/p repair Dec 2014) who presents with AMS and hypotension.   AMS: Likely due to her hypernatremia and sepsis. Had improved initially but worse again today.   Hypernatremia: Na 156 on presentation, likely 2/2 to dehydration due to no intake per mouth as reported per her son. Also with N/V, diarrhea days before presentation. This has all likely led to her hypovolemia and presentation with hypotension, pre-renal azotemia and ischemic hepatitis. Free water deficit is ~ 3.1L today. BP has improved. No signs of volume overload on physical exam.  - continue IVF hydration: she had rapid correction with D5 w, will use D5W 1/2 NS and continue assessment for volume status.   Severe sepsis: She had tachycardia, tachypneic, WBC 31.7 on presentation and evidence of hypoperfusion with  hypotension, lactic acid 14.25 and elevated creatinine, liver enzymes and troponin. Lactic acid improved to 2.0 with IVF. Uurine culture negative. She has 1 of 2 blood culture bottles growing MRSA. She has multiple skin lesions and one with ulceration that could be the source of MRSA for her bacteremia.  - continue IVF hydration per above.  - continue empiric antibiotics - Vancomycin   - Zofran prn  - Continue PICC--patient has poor peripheral venous access  - CMP and CBC q12h for sodium and Hg trending  GI bleed: FOBT + and two episodes of diarrhea in the ED (one with gross blood per nurse). Dark (but not coffee ground) emesis followed by several dark BMs. Hgb 14.3--> 12.0 after IV fluids. GI consulted no recommendation for further imaging or work up since her Hg has stabilized. Source of bleeding unclear but does not  appear severe. Deferring endoscopy until other medical issues resolved and only if continued bleeding. Stool pathogen panel negative.  - IV protonix  - trial of clear liquids after SLP evaluation -patient's AMS has prevented SLP evaluation.   Positive troponin: Trop 4.37--> 10.17--> 9.22. Likely 2/2 to demand ischemia. Spoke with cardiology and given GI bleed not a candidate for heparin or ASA.   DM type 2: Hgb A1c 7.04 Jan 2013. Home medication is metformin. Hgb stable 78-136 on SSI sensitive.  - continue SSI sensitive   Diet: Appreciate SLP eval. She is NPO except for meds with puree. SLP following, will do assessment once her AMS improves.   Goals of care: Patient's son does not have HPOA but is the next of kin. His sister Melba Coon, who lives in [place], is in agreement with his medication decisions on behalf of their mother. Although the patient does not have an advance directive, the son reports that their father had one and at that time his mother had expressed that she would not want heroic measures.  -Consulted Palliative Care Medicine   VTE ppx: SCDs only  in the setting of GI bleed  Dispo: Disposition is deferred at this time, awaiting improvement of current medical problems.  Anticipated discharge in approximately 2-3 day(s).   The patient does not have a current PCP (No Pcp Per Patient) and does need an [facility] hospital follow-up appointment after discharge.  The patient does not have transportation limitations that hinder transportation to clinic appointments.  .Services Needed at time of discharge: Y = Yes, Blank = No PT:   OT:   RN:   Equipment:   Other:     LOS: 3 days   [name], MD 03/29/2013, 10:35 AM

## 2013-03-29 NOTE — Progress Notes (Signed)
Speech Language Pathology Treatment: Dysphagia  Patient Details Name: Kaitlin Brennan MRN: [id number] DOB: May 15, 1921 Today's Date: 03/29/2013 Time: [id number] SLP Time Calculation (min): 17 min  Assessment / Plan / Recommendation Clinical Impression  F/u after yesterday's clinical swallow eval.  Pt remains confused, shouting out "51" repetitively.  Consumed trials ice chips, 1/2 tspns water with max verba/visual cues to remain alert and focus attention.  Pt with coughing after trials and wet-sounding phonation.  Poor arousal prohibitive of safe eating at this time - D/Cd attempts to feed.   Recommend continued NPO for now; crush meds to give with puree if needed and if pt can be sufficiently aroused.   Per RN, Palliative Medicine has been consulted; will await decision for GOC.    HPI HPI: Pt is an 80 year old female,  had repair of hip fracture 12/14 admitted from SNF with decreased oral intake and altered minimal status.  She had been on antibiotics for UTI recently by history. There is concern for GI bleed per GI notes. Recommended clear liquid diet.    Pertinent Vitals SP02 91%, RR 19  SLP Plan  Continue with current plan of care    Recommendations Diet recommendations: NPO Medication Administration: Crushed with puree Postural Changes and/or Swallow Maneuvers: Seated upright 90 degrees              Oral Care Recommendations: Oral care Q4 per protocol Plan: Continue with current plan of care   Nikky Duba L. Samson Fredericouture, [place]MA CCC/SLP Pager [phone]      Blenda MountsCouture, Eisa Necaise Laurice 03/29/2013, 11:40 AM

## 2013-03-29 NOTE — Progress Notes (Signed)
PT Cancellation Note  Patient Details Name: Kaitlin Brennan MRN: [id number] DOB: Dec 10, 1921   Cancelled Treatment:    Reason Eval/Treat Not Completed: Medical issues which prohibited therapy.  Patient's mental status has declined - not following commands and lethargic.  Per MD note, Palliative Care consult has been ordered.  Will hold PT until GOC have been determined.  Will f/u Monday.   [name], [name] 03/29/2013, 1:59 PM Durenda HurtSusan H. Renaldo Fiddleravis, PT, [employer] Surgical[facility] Acute Rehab Services Pager [phone]

## 2013-03-29 NOTE — Progress Notes (Signed)
Internal Medicine Attending  Date: 03/29/2013  Patient name: Kaitlin CottonJosephine Brennan Medical record number: [id number] Date of birth: 09-Aug-1921 Age: 80 y.o. Gender: female  I saw and evaluated the patient. I reviewed the resident's note by Dr. Garald BraverKennerly and I agree with the resident's findings and plans as documented in her progress note.  Ms. Kaitlin Brennan's mental status is still poor.  She is arousable but does not follow commands or answer questions.  She has been adequately volume resuscitated and continues on the Vancomycin for the MRSA bacteremia.  We will work on free water repletion today in hopes of improving her mental status so she can hopefully participate in her goals of care discussions.

## 2013-03-29 NOTE — Progress Notes (Signed)
In and out cath done 450 cc out

## 2013-03-29 NOTE — Progress Notes (Signed)
PAtient has not voided post foley removal. Bladder scan done = 466 ml. Md notified

## 2013-03-30 LAB — COMPREHENSIVE METABOLIC PANEL
ALBUMIN: 1.6 g/dL — AB (ref 3.5–5.2)
ALBUMIN: 1.4 g/dL — AB (ref 3.5–5.2)
ALK PHOS: 107 U/L (ref 39–117)
ALT: 103 U/L — AB (ref 0–35)
ALT: 86 U/L — ABNORMAL HIGH (ref 0–35)
AST: 43 U/L — AB (ref 0–37)
AST: 32 U/L (ref 0–37)
Alkaline Phosphatase: 85 U/L (ref 39–117)
BILIRUBIN TOTAL: 0.4 mg/dL (ref 0.3–1.2)
BILIRUBIN TOTAL: 0.4 mg/dL (ref 0.3–1.2)
BUN: 17 mg/dL (ref 6–23)
BUN: 14 mg/dL (ref 6–23)
CALCIUM: 7.3 mg/dL — AB (ref 8.4–10.5)
CHLORIDE: 120 meq/L — AB (ref 96–112)
CHLORIDE: 116 meq/L — AB (ref 96–112)
CO2: 25 meq/L (ref 19–32)
CO2: 24 mEq/L (ref 19–32)
Calcium: 7.7 mg/dL — ABNORMAL LOW (ref 8.4–10.5)
Creatinine, Ser: 0.94 mg/dL (ref 0.50–1.10)
Creatinine, Ser: 0.9 mg/dL (ref 0.50–1.10)
GFR calc Af Amer: 61 mL/min — ABNORMAL LOW (ref >90)
GFR calc Af Amer: 64 mL/min — ABNORMAL LOW (ref >90)
GFR, EST NON AFRICAN AMERICAN: 52 mL/min — AB (ref >90)
GFR, EST NON AFRICAN AMERICAN: 55 mL/min — AB (ref >90)
Glucose, Bld: 148 mg/dL — ABNORMAL HIGH (ref 70–99)
Glucose, Bld: 173 mg/dL — ABNORMAL HIGH (ref 70–99)
POTASSIUM: 3.7 meq/L (ref 3.7–5.3)
Potassium: 3 mEq/L — ABNORMAL LOW (ref 3.7–5.3)
SODIUM: 154 meq/L — AB (ref 137–147)
Sodium: 149 mEq/L — ABNORMAL HIGH (ref 137–147)
Total Protein: 4.7 g/dL — ABNORMAL LOW (ref 6.0–8.3)
Total Protein: 4.4 g/dL — ABNORMAL LOW (ref 6.0–8.3)

## 2013-03-30 LAB — CBC
HCT: 31.5 % — ABNORMAL LOW (ref 36.0–46.0)
HEMATOCRIT: 30 % — AB (ref 36.0–46.0)
Hemoglobin: 10.2 g/dL — ABNORMAL LOW (ref 12.0–15.0)
Hemoglobin: 9.8 g/dL — ABNORMAL LOW (ref 12.0–15.0)
MCH: 28 pg (ref 26.0–34.0)
MCH: 28.4 pg (ref 26.0–34.0)
MCHC: 32.4 g/dL (ref 30.0–36.0)
MCHC: 32.7 g/dL (ref 30.0–36.0)
MCV: 86.5 fL (ref 78.0–100.0)
MCV: 87 fL (ref 78.0–100.0)
PLATELETS: 161 10*3/uL (ref 150–400)
PLATELETS: 138 10*3/uL — AB (ref 150–400)
RBC: 3.64 MIL/uL — ABNORMAL LOW (ref 3.87–5.11)
RBC: 3.45 MIL/uL — ABNORMAL LOW (ref 3.87–5.11)
RDW: 19.8 % — AB (ref 11.5–15.5)
RDW: 19.7 % — AB (ref 11.5–15.5)
WBC: 16 10*3/uL — AB (ref 4.0–10.5)
WBC: 13.5 10*3/uL — ABNORMAL HIGH (ref 4.0–10.5)

## 2013-03-30 LAB — GLUCOSE, CAPILLARY
GLUCOSE-CAPILLARY: 113 mg/dL — AB (ref 70–99)
GLUCOSE-CAPILLARY: 125 mg/dL — AB (ref 70–99)
GLUCOSE-CAPILLARY: 143 mg/dL — AB (ref 70–99)
GLUCOSE-CAPILLARY: 77 mg/dL (ref 70–99)
GLUCOSE-CAPILLARY: 111 mg/dL — AB (ref 70–99)
Glucose-Capillary: 83 mg/dL (ref 70–99)
Glucose-Capillary: 103 mg/dL — ABNORMAL HIGH (ref 70–99)

## 2013-03-30 MED ORDER — DEXTROSE 5 % IV SOLN
INTRAVENOUS | Status: DC
  Administered 2013-03-30 – 2013-03-31 (×2): via INTRAVENOUS

## 2013-03-30 MED ORDER — VANCOMYCIN HCL IN DEXTROSE 1-5 GM/200ML-% IV SOLN
1000.0000 mg | INTRAVENOUS | Status: DC
  Administered 2013-03-30 – 2013-03-31 (×2): 1000 mg via INTRAVENOUS
  Filled 2013-03-30 (×4): qty 200

## 2013-03-30 NOTE — Progress Notes (Signed)
[facility] Progress Note  Subjective: Patient a little more alert and cognizant today, no signs of GI bleeding  Objective: Vital signs in last 24 hours: Temp:  [97.4 F (36.3 C)-98.2 F (36.8 C)] 97.9 F (36.6 C) (03/01 0447) Pulse Rate:  [71-90] 87 (03/01 0940) Resp:  [13-22] 21 (03/01 0940) BP: (91-123)/(48-95) 110/81 mmHg (03/01 0800) SpO2:  [87 %-100 %] 95 % (03/01 0940) Weight change:    PE: Unchanged  Lab Results: Results for orders placed during the hospital encounter of 03/26/13 (from the past 24 hour(s))  GLUCOSE, CAPILLARY     Status: Abnormal   Collection Time    03/29/13 12:31 PM      Result Value Ref Range   Glucose-Capillary 133 (*) 70 - 99 mg/dL  GLUCOSE, CAPILLARY     Status: None   Collection Time    03/29/13  5:00 PM      Result Value Ref Range   Glucose-Capillary 99  70 - 99 mg/dL  CBC     Status: Abnormal   Collection Time    03/29/13  6:00 PM      Result Value Ref Range   WBC 15.2 (*) 4.0 - 10.5 K/uL   RBC 3.49 (*) 3.87 - 5.11 MIL/uL   Hemoglobin 9.7 (*) 12.0 - 15.0 g/dL   HCT 16.130.1 (*) 09.636.0 - 04.546.0 %   MCV 86.2  78.0 - 100.0 fL   MCH 27.8  26.0 - 34.0 pg   MCHC 32.2  30.0 - 36.0 g/dL   RDW 40.920.0 (*) 81.111.5 - 91.415.5 %   Platelets 160  150 - 400 K/uL  COMPREHENSIVE METABOLIC PANEL     Status: Abnormal   Collection Time    03/29/13  6:00 PM      Result Value Ref Range   Sodium 155 (*) 137 - 147 mEq/L   Potassium 3.6 (*) 3.7 - 5.3 mEq/L   Chloride 120 (*) 96 - 112 mEq/L   CO2 25  19 - 32 mEq/L   Glucose, Bld 125 (*) 70 - 99 mg/dL   BUN 22  6 - 23 mg/dL   Creatinine, Ser 7.821.09  0.50 - 1.10 mg/dL   Calcium 7.7 (*) 8.4 - 10.5 mg/dL   Total Protein 4.4 (*) 6.0 - 8.3 g/dL   Albumin 1.7 (*) 3.5 - 5.2 g/dL   AST 44 (*) 0 - 37 U/L   ALT 119 (*) 0 - 35 U/L   Alkaline Phosphatase 307 (*) 39 - 117 U/L   Total Bilirubin 0.4  0.3 - 1.2 mg/dL   GFR calc non Af Amer 44 (*) >90 mL/min   GFR calc Af Amer 51 (*) >90 mL/min  GLUCOSE, CAPILLARY      Status: Abnormal   Collection Time    03/29/13  8:51 PM      Result Value Ref Range   Glucose-Capillary 123 (*) 70 - 99 mg/dL  GLUCOSE, CAPILLARY     Status: None   Collection Time    03/30/13  1:28 AM      Result Value Ref Range   Glucose-Capillary 83  70 - 99 mg/dL  GLUCOSE, CAPILLARY     Status: Abnormal   Collection Time    03/30/13  4:50 AM      Result Value Ref Range   Glucose-Capillary 113 (*) 70 - 99 mg/dL  CBC     Status: Abnormal   Collection Time    03/30/13  5:55 AM  Result Value Ref Range   WBC 16.0 (*) 4.0 - 10.5 K/uL   RBC 3.64 (*) 3.87 - 5.11 MIL/uL   Hemoglobin 10.2 (*) 12.0 - 15.0 g/dL   HCT 45.4 (*) 09.8 - 11.9 %   MCV 86.5  78.0 - 100.0 fL   MCH 28.0  26.0 - 34.0 pg   MCHC 32.4  30.0 - 36.0 g/dL   RDW 14.7 (*) 82.9 - 56.2 %   Platelets 161  150 - 400 K/uL  COMPREHENSIVE METABOLIC PANEL     Status: Abnormal   Collection Time    03/30/13  5:55 AM      Result Value Ref Range   Sodium 154 (*) 137 - 147 mEq/L   Potassium 3.7  3.7 - 5.3 mEq/L   Chloride 120 (*) 96 - 112 mEq/L   CO2 25  19 - 32 mEq/L   Glucose, Bld 148 (*) 70 - 99 mg/dL   BUN 17  6 - 23 mg/dL   Creatinine, Ser 1.30  0.50 - 1.10 mg/dL   Calcium 7.7 (*) 8.4 - 10.5 mg/dL   Total Protein 4.7 (*) 6.0 - 8.3 g/dL   Albumin 1.6 (*) 3.5 - 5.2 g/dL   AST 43 (*) 0 - 37 U/L   ALT 103 (*) 0 - 35 U/L   Alkaline Phosphatase 107  39 - 117 U/L   Total Bilirubin 0.4  0.3 - 1.2 mg/dL   GFR calc non Af Amer 52 (*) >90 mL/min   GFR calc Af Amer 61 (*) >90 mL/min  GLUCOSE, CAPILLARY     Status: Abnormal   Collection Time    03/30/13  8:06 AM      Result Value Ref Range   Glucose-Capillary 125 (*) 70 - 99 mg/dL    Studies/Results: No results found.    Assessment: Low-grade evidence of GI bleeding on presentation has not persisted since admission although some fall in hemoglobin.  Plan: I would elect to treat this patient empirically with a 6 week course of proton pump inhibitor as long as no  further hematemesis melena or hematochezia associated with significant further hemoglobin drop. We'll sign off for now but do not hesitate to call back if signs of further bleeding. Thank you for the consult.    Jovann Luse C 03/30/2013, 11:54 AM

## 2013-03-30 NOTE — Progress Notes (Signed)
ANTIBIOTIC CONSULT NOTE - FOLLOW UP  Pharmacy Consult for Vancomycin Indication: MRSA Bacteremia  No Known Allergies  Patient Measurements: Height: 5' (152.4 cm) Weight: 142 lb 13.7 oz (64.8 kg) IBW/kg (Calculated) : 45.5  Vital Signs: Temp: 97.6 F (36.4 C) (03/01 1200) Temp src: Axillary (03/01 1200) BP: 109/43 mmHg (03/01 1200) Pulse Rate: 86 (03/01 1200) Intake/Output from previous day: 02/28 0701 - 03/01 0700 In: 1819.6 [I.V.:1819.6] Out: 850 [Urine:850] Intake/Output from this shift: Total I/O In: 450 [I.V.:450] Out: -   Labs:  Recent Labs  03/29/13 0600 03/29/13 1800 03/30/13 0555  WBC 15.1* 15.2* 16.0*  HGB 9.4* 9.7* 10.2*  PLT 170 160 161  CREATININE 1.26* 1.09 0.94   Estimated Creatinine Clearance: 34.1 ml/min (by C-G formula based on Cr of 0.94). No results found for this basename: VANCOTROUGH, Leodis BinetVANCOPEAK, VANCORANDOM, GENTTROUGH, GENTPEAK, GENTRANDOM, TOBRATROUGH, TOBRAPEAK, TOBRARND, AMIKACINPEAK, AMIKACINTROU, AMIKACIN,  in the last 72 hours   Microbiology: Recent Results (from the past 720 hour(s))  CULTURE, BLOOD (ROUTINE X 2)     Status: None   Collection Time    03/26/13 12:01 PM      Result Value Ref Range Status   Specimen Description BLOOD LEFT ANTECUBITAL   Final   Special Requests BOTTLES DRAWN AEROBIC ONLY 5CC   Final   Culture  Setup Time     Final   Value: 03/26/2013 16:11     [facility]formed [facility]   Culture     Final   Value: METHICILLIN RESISTANT STAPHYLOCOCCUS AUREUS     Note: RIFAMPIN AND GENTAMICIN SHOULD NOT BE USED AS SINGLE DRUGS FOR TREATMENT OF STAPH INFECTIONS. This organism DOES NOT demonstrate inducible Clindamycin resistance in vitro.     Note: Gram Stain Report Called to,Read Back By and Verified With: [facility]BETH HANCOCK 03/27/13 9:00PM THOMI     Performed at [facility]   Report Status 03/29/2013 FINAL   Final   Organism ID, Bacteria METHICILLIN RESISTANT STAPHYLOCOCCUS AUREUS   Final  URINE CULTURE      Status: None   Collection Time    03/26/13 12:25 PM      Result Value Ref Range Status   Specimen Description URINE, CATHETERIZED   Final   Special Requests NONE   Final   Culture  Setup Time     Final   Value: 03/26/2013 13:46     [facility]formed [facility]   Colony Count     Final   Value: NO GROWTH     Performed at [facility]   Culture     Final   Value: NO GROWTH     Performed at [facility]   Report Status 03/27/2013 FINAL   Final  CLOSTRIDIUM DIFFICILE BY PCR     Status: None   Collection Time    03/26/13  4:03 PM      Result Value Ref Range Status   C difficile by pcr NEGATIVE  NEGATIVE Final  MRSA PCR SCREENING     Status: Abnormal   Collection Time    03/26/13  8:02 PM      Result Value Ref Range Status   MRSA by PCR POSITIVE (*) NEGATIVE Final   Comment:            The GeneXpert MRSA Assay (FDA     approved for NASAL specimens     only), is one component of a     comprehensive MRSA colonization     surveillance  program. It is not     intended to diagnose MRSA     infection nor to guide or     monitor treatment for     MRSA infections.     RESULT CALLED TO, READ BACK BY AND VERIFIED WITH:     Bridget Hartshorn RN 2154 03/26/13 A BROWNING  CULTURE, BLOOD (ROUTINE X 2)     Status: None   Collection Time    03/26/13  8:35 PM      Result Value Ref Range Status   Specimen Description BLOOD LEFT ARM   Final   Special Requests BOTTLES DRAWN AEROBIC ONLY 2CC   Final   Culture  Setup Time     Final   Value: 03/27/2013 01:17     Performed at [facility]   Culture     Final   Value:        BLOOD CULTURE RECEIVED NO GROWTH TO DATE CULTURE WILL BE HELD FOR 5 DAYS BEFORE ISSUING A FINAL NEGATIVE REPORT     Performed at [facility]   Report Status PENDING   Incomplete  CULTURE, BLOOD (ROUTINE X 2)     Status: None   Collection Time    03/28/13  4:35 AM      Result Value Ref Range Status   Specimen Description BLOOD RIGHT FOOT    Final   Special Requests BOTTLES DRAWN AEROBIC ONLY 3CC   Final   Culture  Setup Time     Final   Value: 03/28/2013 08:29     Performed at [facility]   Culture     Final   Value:        BLOOD CULTURE RECEIVED NO GROWTH TO DATE CULTURE WILL BE HELD FOR 5 DAYS BEFORE ISSUING A FINAL NEGATIVE REPORT     Performed at [facility]   Report Status PENDING   Incomplete  CULTURE, BLOOD (ROUTINE X 2)     Status: None   Collection Time    03/28/13  4:40 AM      Result Value Ref Range Status   Specimen Description BLOOD RIGHT HAND   Final   Special Requests BOTTLES DRAWN AEROBIC ONLY 2CC   Final   Culture  Setup Time     Final   Value: 03/28/2013 08:29     Performed at [facility]   Culture     Final   Value:        BLOOD CULTURE RECEIVED NO GROWTH TO DATE CULTURE WILL BE HELD FOR 5 DAYS BEFORE ISSUING A FINAL NEGATIVE REPORT     Performed at [facility]   Report Status PENDING   Incomplete    Anti-infectives   Start     Dose/Rate Route Frequency Ordered Stop   03/30/13 1400  vancomycin (VANCOCIN) IVPB 1000 mg/200 mL premix     1,000 mg 200 mL/hr over 60 Minutes Intravenous Every 24 hours 03/30/13 1336     03/28/13 1400  vancomycin (VANCOCIN) IVPB 750 mg/150 ml premix  Status:  Discontinued     750 mg 150 mL/hr over 60 Minutes Intravenous Every 48 hours 03/27/13 1044 03/30/13 1336   03/27/13 1900  piperacillin-tazobactam (ZOSYN) IVPB 2.25 g  Status:  Discontinued     2.25 g 100 mL/hr over 30 Minutes Intravenous 3 times per day 03/27/13 1044 03/28/13 1501   03/27/13 1400  vancomycin (VANCOCIN) IVPB 750 mg/150 ml premix  Status:  Discontinued  750 mg 150 mL/hr over 60 Minutes Intravenous Every 24 hours 03/26/13 1843 03/27/13 1044   03/26/13 2200  piperacillin-tazobactam (ZOSYN) IVPB 3.375 g  Status:  Discontinued     3.375 g 12.5 mL/hr over 240 Minutes Intravenous 3 times per day 03/26/13 1844 03/27/13 1044   03/26/13 1345  vancomycin (VANCOCIN) IVPB  1000 mg/200 mL premix     1,000 mg 200 mL/hr over 60 Minutes Intravenous  Once 03/26/13 1341 03/26/13 1459   03/26/13 1345  piperacillin-tazobactam (ZOSYN) IVPB 3.375 g     3.375 g 12.5 mL/hr over 240 Minutes Intravenous  Once 03/26/13 1341 03/26/13 1759      Assessment: 80 yo F presents w/ AMS, now found to have MRSA growing in 1 of 2 blood cultures, unclear source (pt had hip fx in dec 2014). Patient is currently afeb, wbc elevated, and renal function improving.   Goal of Therapy:  Vancomycin trough level 15-20 mcg/ml  Plan:  - Adjust Vanc 1g IV q24,  - F/u with VT at [facility] - Monitor renal function, clinical progression and cultures  - ID should be consulted for MRSA in blood  Forestine Na M 03/30/2013,1:37 PM

## 2013-03-30 NOTE — Progress Notes (Signed)
Palliative consult received and I have reviewed Ms. Kaitlin Brennan's chart in detail. I have placed a call to her son and offered two appointment times to meet with a provider from our team- currently awaiting a call back. Ideally we will need to include Ms. Kaitlin Brennan in this discussion if she is able- and if she proves to have full capacity, work on formalizing her advance directives and HCPOA. Thank you for this consult and we look forward to assisting with her palliative care needs. She is resting comfortably and there do not appear to be any acute symptom management needs.   Anderson MaltaElizabeth Keonda Dow, DO Palliative Medicine

## 2013-03-30 NOTE — Progress Notes (Addendum)
Subjective: Mental status improved today. She is responsive and smiling. She knows her name is "Joe" but cannot tell me the year or where she is. She is picking at her mittens. Denies chest pain, shortness of breath, abdominal pain.  Objective: Vital signs in last 24 hours: Filed Vitals:   03/29/13 2000 03/30/13 0000 03/30/13 0130 03/30/13 0447  BP: 94/75 91/58 120/68 123/48  Pulse: 84 84 71 82  Temp: 98 F (36.7 C) 97.4 F (36.3 C)  97.9 F (36.6 C)  TempSrc: Axillary Axillary  Axillary  Resp: 14 20 22 21   Height:      Weight:      SpO2: 92% 100% 95%    Weight change:   Intake/Output Summary (Last 24 hours) at 03/30/13 0732 Last data filed at 03/30/13 0600  Gross per 24 hour  Intake 1819.58 ml  Output    850 ml  Net 969.58 ml   General: resting in bed in NAD  HEENT: Moist mucus membranes, cancerous ulcerated lesion noted on her left neck infraauricular Cardiac: RRR, no rubs, murmurs or gallops  Pulm: clear to auscultation bilaterally, moving normal volumes of air  Abd: soft, nontender, nondistended  Ext: SCDs in place, no pedal edema, 2+ DPs. Forearms covered with soft bandages, scattered small erythematous skin lesions in upper arm and face (per records patient has history of basal cell carcinoma) Neuro: awake and alert, oriented x1, follows commands, moving all extremities   Lab Results: Basic Metabolic Panel:  Recent Labs Lab 03/26/13 1725 03/27/13  03/29/13 1800 03/30/13 0555  NA 156* 159*  < > 155* 154*  K 3.9 3.4*  < > 3.6* 3.7  CL 115* 119*  < > 120* 120*  CO2 17* 20  < > 25 25  GLUCOSE 233* 154*  < > 125* 148*  BUN 60* 67*  < > 22 17  CREATININE 1.95* 2.08*  < > 1.09 0.94  CALCIUM 7.9* 7.9*  < > 7.7* 7.7*  MG  --  3.1*  --   --   --   < > = values in this interval not displayed. Liver Function Tests:  Recent Labs Lab 03/29/13 1800 03/30/13 0555  AST 44* 43*  ALT 119* 103*  ALKPHOS 307* 107  BILITOT 0.4 0.4  PROT 4.4* 4.7*  ALBUMIN 1.7*  1.6*   CBC:  Recent Labs Lab 03/26/13 1200  03/29/13 1800 03/30/13 0555  WBC 31.7*  < > 15.2* 16.0*  NEUTROABS 28.8*  --   --   --   HGB 14.3  < > 9.7* 10.2*  HCT 44.6  < > 30.1* 31.5*  MCV 89.4  < > 86.2 86.5  PLT 201  < > 160 161  < > = values in this interval not displayed. Cardiac Enzymes:  Recent Labs Lab 03/26/13 1725 03/26/13 2348 03/27/13 0542  TROPONINI 4.37* 10.17* 9.22*   CBG:  Recent Labs Lab 03/29/13 0802 03/29/13 1231 03/29/13 1700 03/29/13 2051 03/30/13 0128 03/30/13 0450  GLUCAP 112* 133* 99 123* 83 113*   Hemoglobin A1C:  Recent Labs Lab 03/26/13 2028  HGBA1C 5.4   Urinalysis:  Recent Labs Lab 03/26/13 1225  COLORURINE AMBER*  LABSPEC 1.024  PHURINE 5.0  GLUCOSEU 100*  HGBUR NEGATIVE  BILIRUBINUR MODERATE*  KETONESUR 15*  PROTEINUR NEGATIVE  UROBILINOGEN 0.2  NITRITE NEGATIVE  LEUKOCYTESUR SMALL*   Micro Results: Recent Results (from the past 240 hour(s))  CULTURE, BLOOD (ROUTINE X 2)     Status: None  Collection Time    03/26/13 12:01 PM      Result Value Ref Range Status   Specimen Description BLOOD LEFT ANTECUBITAL   Final   Special Requests BOTTLES DRAWN AEROBIC ONLY 5CC   Final   Culture  Setup Time     Final   Value: 03/26/2013 16:11     Performed at [facility]   Culture     Final   Value: METHICILLIN RESISTANT STAPHYLOCOCCUS AUREUS     Note: RIFAMPIN AND GENTAMICIN SHOULD NOT BE USED AS SINGLE DRUGS FOR TREATMENT OF STAPH INFECTIONS. This organism DOES NOT demonstrate inducible Clindamycin resistance in vitro.     Note: Gram Stain Report Called to,Read Back By and Verified With: [facility] HANCOCK 03/27/13 9:00PM THOMI     Performed at [facility]   Report Status 03/29/2013 FINAL   Final   Organism ID, Bacteria METHICILLIN RESISTANT STAPHYLOCOCCUS AUREUS   Final  URINE CULTURE     Status: None   Collection Time    03/26/13 12:25 PM      Result Value Ref Range Status   Specimen Description  URINE, CATHETERIZED   Final   Special Requests NONE   Final   Culture  Setup Time     Final   Value: 03/26/2013 13:46     Performed at [facility] Count     Final   Value: NO GROWTH     Performed at [facility]   Culture     Final   Value: NO GROWTH     Performed at [facility]   Report Status 03/27/2013 FINAL   Final  CLOSTRIDIUM DIFFICILE BY PCR     Status: None   Collection Time    03/26/13  4:03 PM      Result Value Ref Range Status   C difficile by pcr NEGATIVE  NEGATIVE Final  MRSA PCR SCREENING     Status: Abnormal   Collection Time    03/26/13  8:02 PM      Result Value Ref Range Status   MRSA by PCR POSITIVE (*) NEGATIVE Final   Comment:            The GeneXpert MRSA Assay (FDA     approved for NASAL specimens     only), is one component of a     comprehensive MRSA colonization     surveillance program. It is not     intended to diagnose MRSA     infection nor to guide or     monitor treatment for     MRSA infections.     RESULT CALLED TO, READ BACK BY AND VERIFIED WITH:     Bridget Hartshorn RN 2154 03/26/13 A BROWNING  CULTURE, BLOOD (ROUTINE X 2)     Status: None   Collection Time    03/26/13  8:35 PM      Result Value Ref Range Status   Specimen Description BLOOD LEFT ARM   Final   Special Requests BOTTLES DRAWN AEROBIC ONLY 2CC   Final   Culture  Setup Time     Final   Value: 03/27/2013 01:17     Performed at [facility]   Culture     Final   Value:        BLOOD CULTURE RECEIVED NO GROWTH TO DATE CULTURE WILL BE HELD FOR 5 DAYS BEFORE ISSUING A FINAL NEGATIVE REPORT     Performed at [facility]   Report Status PENDING   Incomplete  CULTURE, BLOOD (ROUTINE X 2)     Status: None   Collection Time    03/28/13  4:35 AM      Result Value Ref Range Status   Specimen Description BLOOD RIGHT FOOT   Final   Special Requests BOTTLES DRAWN AEROBIC ONLY 3CC   Final   Culture  Setup Time     Final   Value: 03/28/2013  08:29     Performed at [facility]   Culture     Final   Value:        BLOOD CULTURE RECEIVED NO GROWTH TO DATE CULTURE WILL BE HELD FOR 5 DAYS BEFORE ISSUING A FINAL NEGATIVE REPORT     Performed at [facility]   Report Status PENDING   Incomplete  CULTURE, BLOOD (ROUTINE X 2)     Status: None   Collection Time    03/28/13  4:40 AM      Result Value Ref Range Status   Specimen Description BLOOD RIGHT HAND   Final   Special Requests BOTTLES DRAWN AEROBIC ONLY 2CC   Final   Culture  Setup Time     Final   Value: 03/28/2013 08:29     Performed at [facility]   Culture     Final   Value:        BLOOD CULTURE RECEIVED NO GROWTH TO DATE CULTURE WILL BE HELD FOR 5 DAYS BEFORE ISSUING A FINAL NEGATIVE REPORT     Performed at [facility]   Report Status PENDING   Incomplete   Studies/Results: No results found. Medications: I have reviewed the patient's current medications. Scheduled Meds: . antiseptic oral rinse  15 mL Mouth Rinse BID  . chlorhexidine  15 mL Mouth Rinse BID  . insulin aspart  0-9 Units Subcutaneous 6 times per day  . LORazepam  0.25 mg Intravenous Once  . pantoprazole (PROTONIX) IV  40 mg Intravenous Q12H  . sodium chloride  1,000 mL Intravenous Once  . sodium chloride  10-40 mL Intracatheter Q12H  . sodium chloride  3 mL Intravenous Q12H  . vancomycin  750 mg Intravenous Q48H   Continuous Infusions: . dextrose 5 % and 0.45% NaCl 75 mL/hr at 03/29/13 2137   PRN Meds:.morphine injection, sodium chloride Assessment/Plan: 80 year old woman with DM22, basal cell carcinoma, right intertrochanteric femur fracture (s/p repair Dec 2014) who presents with AMS and hypotension.   Severe sepsis: She had tachycardia, tachypneic, WBC 31.7 on presentation and evidence of hypoperfusion with hypotension, lactic acid 14.25 and elevated creatinine, liver enzymes and troponin. Lactic acid improved to 2.0 with IVF. Uurine culture negative. She had 1 of  2 blood culture bottles growing MRSA from admission 2/25. She has multiple skin lesions and one with ulceration that could be the source of MRSA for her bacteremia. Repeat blood cx 2/27 are NGTD. - continue IVF hydration with D5W 1/2 NS @75  cc/hr - continue empiric antibiotics - Vancomycin   - Zofran prn  - Continue PICC--patient has poor peripheral venous access  - CMP and CBC q12h for sodium and Hg trending (see below)  GI bleed: FOBT + and two episodes of diarrhea in the ED (one with gross blood per nurse). Dark (but not coffee ground) emesis followed by several dark BMs. Hgb 14.3--> 10.2 after IV fluids. Patient was likely hemo-concentrated on admission. GI consulted no recommendation for further imaging or work up  since her Hg has stabilized. Source of bleeding unclear but does not appear severe. Deferring endoscopy until other medical issues resolved and only if continued bleeding. Stool pathogen panel negative. Rectal tube in place. - IV protonix  - NPO per SLP, her mental status may allow for a better exam today  Hemoglobin  Date Value Ref Range Status  03/30/2013 10.2* 12.0 - 15.0 g/dL Final  1/61/09602/28/2015 9.7* 45.412.0 - 15.0 g/dL Final  0/98/11912/28/2015 9.4* 47.812.0 - 15.0 g/dL Final  2/95/62132/27/2015 08.610.6* 12.0 - 15.0 g/dL Final  5/78/46962/27/2015 29.511.2* 12.0 - 15.0 g/dL Final    Hypernatremia: Na 156 on presentation, likely 2/2 to dehydration due to no intake per mouth as reported per her son. Also with N/V, diarrhea days before presentation. This has all likely led to her hypovolemia and presentation with hypotension, pre-renal azotemia and ischemic hepatitis. Free water deficit is 3.9L today. BP has improved. No signs of volume overload on physical exam. She had rapid correction with D5W @100  cc/hr from 158>144 on Friday, so we have been using D5W 1/2 NS @75  cc/hr/ However her Na is still 154. Trend as below. - Changing fluids to d5w @75  cc/hr for 24 hours - continue assessment for volume status - CMP  q12h  Sodium  Date Value Ref Range Status  03/30/2013 154* 137 - 147 mEq/L Final  03/29/2013 155* 137 - 147 mEq/L Final  03/29/2013 155* 137 - 147 mEq/L Final  03/28/2013 159* 137 - 147 mEq/L Final  03/28/2013 144  137 - 147 mEq/L Final  03/28/2013 158* 137 - 147 mEq/L Final  03/27/2013 153* 137 - 147 mEq/L Final  03/27/2013 158* 137 - 147 mEq/L Final  03/27/2013 159* 137 - 147 mEq/L Final  03/26/2013 156* 137 - 147 mEq/L Final    AMS: Likely due to her hypernatremia and sepsis. Improving today with therapies and D/c foley. - In and out cath prn  Positive troponin: Trop 4.37--> 10.17--> 9.22. Likely 2/2 to demand ischemia. Spoke with cardiology and given GI bleed not a candidate for heparin or ASA.   DM type 2: Hgb A1c 7.04 Jan 2013. Home medication is metformin. Hgb stable 78-136 on SSI sensitive.  - continue SSI sensitive   Diet: Appreciate SLP eval. She is NPO except for meds with puree.   Goals of care: Patient's son does not have HPOA but is the next of kin. His sister Melba CoonKathy Mower, who lives in [place], is in agreement with his medication decisions on behalf of their mother. Although the patient does not have an advance directive, the son reports that their father had one and at that time his mother had expressed that she would not want heroic measures.  - Consulted Palliative Care Medicine for goals of care meeting   VTE ppx: SCDs only in the setting of GI bleed  Dispo: Disposition is deferred at this time, awaiting improvement of current medical problems.  Anticipated discharge in approximately 2-3 day(s).   The patient does not have a current PCP (No Pcp Per Patient) and [facility].  The patient does not have transportation limitations that hinder transportation to clinic appointments.  .Services Needed at time of discharge: Y = Yes, Blank = No PT:   OT:   RN:   Equipment:   Other:     LOS: 4 days   Vivi BarrackSarah Nevan Creighton,  MD 03/30/2013, 7:32 AM

## 2013-03-30 NOTE — Progress Notes (Signed)
Internal Medicine Attending  Date: 03/30/2013  Patient name: Kaitlin CottonJosephine Brennan Medical record number: [id number] Date of birth: 1921-08-16 Age: 80 y.o. Gender: female  I saw and evaluated the patient. I reviewed the resident's note by Dr. Claudell Kyleater and I agree with the resident's findings and plans as documented in her progress note.  Mental status slightly improved today with some free water deficit repletion.  She still has a free water deficit and we will continue to work on repeating it.  I suspect this should result in further improvement in her mental status.  We are also continuing the IV vancomycin for her MRSA bacteremia.  The sepsis has resolved with volume resuscitation and antibiotics.  Next step is to clarify goals of care and this is being worked on.  I am hopeful [name] will be at a point where she can participate in her own goals of care discussion.

## 2013-03-31 DIAGNOSIS — I498 Other specified cardiac arrhythmias: Secondary | ICD-10-CM

## 2013-03-31 DIAGNOSIS — R7881 Bacteremia: Secondary | ICD-10-CM

## 2013-03-31 DIAGNOSIS — E876 Hypokalemia: Secondary | ICD-10-CM

## 2013-03-31 DIAGNOSIS — E860 Dehydration: Secondary | ICD-10-CM

## 2013-03-31 DIAGNOSIS — Z515 Encounter for palliative care: Secondary | ICD-10-CM

## 2013-03-31 DIAGNOSIS — A4902 Methicillin resistant Staphylococcus aureus infection, unspecified site: Secondary | ICD-10-CM

## 2013-03-31 DIAGNOSIS — E43 Unspecified severe protein-calorie malnutrition: Secondary | ICD-10-CM

## 2013-03-31 DIAGNOSIS — Z66 Do not resuscitate: Secondary | ICD-10-CM

## 2013-03-31 LAB — CBC
HEMATOCRIT: 29.9 % — AB (ref 36.0–46.0)
Hemoglobin: 9.9 g/dL — ABNORMAL LOW (ref 12.0–15.0)
MCH: 28.3 pg (ref 26.0–34.0)
MCHC: 33.1 g/dL (ref 30.0–36.0)
MCV: 85.4 fL (ref 78.0–100.0)
Platelets: 150 10*3/uL (ref 150–400)
RBC: 3.5 MIL/uL — ABNORMAL LOW (ref 3.87–5.11)
RDW: 19.3 % — AB (ref 11.5–15.5)
WBC: 13.4 10*3/uL — ABNORMAL HIGH (ref 4.0–10.5)

## 2013-03-31 LAB — GLUCOSE, CAPILLARY
GLUCOSE-CAPILLARY: 114 mg/dL — AB (ref 70–99)
Glucose-Capillary: 110 mg/dL — ABNORMAL HIGH (ref 70–99)
Glucose-Capillary: 98 mg/dL (ref 70–99)
Glucose-Capillary: 104 mg/dL — ABNORMAL HIGH (ref 70–99)
Glucose-Capillary: 99 mg/dL (ref 70–99)

## 2013-03-31 LAB — COMPREHENSIVE METABOLIC PANEL
ALBUMIN: 1.5 g/dL — AB (ref 3.5–5.2)
ALT: 71 U/L — ABNORMAL HIGH (ref 0–35)
AST: 34 U/L (ref 0–37)
Alkaline Phosphatase: 100 U/L (ref 39–117)
BUN: 10 mg/dL (ref 6–23)
CO2: 24 mEq/L (ref 19–32)
CREATININE: 0.81 mg/dL (ref 0.50–1.10)
Calcium: 7.4 mg/dL — ABNORMAL LOW (ref 8.4–10.5)
Chloride: 111 mEq/L (ref 96–112)
GFR calc Af Amer: 72 mL/min — ABNORMAL LOW (ref >90)
GFR calc non Af Amer: 63 mL/min — ABNORMAL LOW (ref >90)
Glucose, Bld: 118 mg/dL — ABNORMAL HIGH (ref 70–99)
Potassium: 3 mEq/L — ABNORMAL LOW (ref 3.7–5.3)
Sodium: 145 mEq/L (ref 137–147)
TOTAL PROTEIN: 4.4 g/dL — AB (ref 6.0–8.3)
Total Bilirubin: 0.4 mg/dL (ref 0.3–1.2)

## 2013-03-31 LAB — BASIC METABOLIC PANEL
BUN: 9 mg/dL (ref 6–23)
CALCIUM: 6.9 mg/dL — AB (ref 8.4–10.5)
CO2: 24 mEq/L (ref 19–32)
CREATININE: 0.8 mg/dL (ref 0.50–1.10)
Chloride: 113 mEq/L — ABNORMAL HIGH (ref 96–112)
GFR, EST AFRICAN AMERICAN: 74 mL/min — AB (ref >90)
GFR, EST NON AFRICAN AMERICAN: 63 mL/min — AB (ref >90)
Glucose, Bld: 116 mg/dL — ABNORMAL HIGH (ref 70–99)
Potassium: 3 mEq/L — ABNORMAL LOW (ref 3.7–5.3)
Sodium: 147 mEq/L (ref 137–147)

## 2013-03-31 LAB — MAGNESIUM
MAGNESIUM: 1.4 mg/dL — AB (ref 1.5–2.5)
Magnesium: 1.4 mg/dL — ABNORMAL LOW (ref 1.5–2.5)

## 2013-03-31 MED ORDER — SODIUM CHLORIDE 0.9 % IV SOLN
INTRAVENOUS | Status: DC
  Administered 2013-03-31: 50 mL/h via INTRAVENOUS

## 2013-03-31 MED ORDER — ONDANSETRON HCL 4 MG/2ML IJ SOLN: 4.0000 mg | Freq: Four times a day (QID) | INTRAMUSCULAR | Status: DC | PRN

## 2013-03-31 MED ORDER — LORAZEPAM 2 MG/ML IJ SOLN: 0.2500 mg | INTRAMUSCULAR | Status: DC | PRN

## 2013-03-31 MED ORDER — POTASSIUM CHLORIDE 10 MEQ/100ML IV SOLN
10.0000 meq | INTRAVENOUS | Status: AC
  Administered 2013-03-31 (×4): 10 meq via INTRAVENOUS
  Filled 2013-03-31 (×4): qty 100

## 2013-03-31 MED ORDER — POTASSIUM CHLORIDE 10 MEQ/100ML IV SOLN
10.0000 meq | INTRAVENOUS | Status: AC
  Administered 2013-03-31 (×4): 10 meq via INTRAVENOUS
  Filled 2013-03-31: qty 100

## 2013-03-31 MED ORDER — MAGNESIUM SULFATE 40 MG/ML IJ SOLN
2.0000 g | Freq: Once | INTRAMUSCULAR | Status: AC
  Administered 2013-03-31: 2 g via INTRAVENOUS
  Filled 2013-03-31: qty 50

## 2013-03-31 MED ORDER — ACETAMINOPHEN 650 MG RE SUPP: 650.0000 mg | Freq: Three times a day (TID) | RECTAL | Status: DC | PRN

## 2013-03-31 NOTE — Progress Notes (Signed)
Night Float Interim Progress Note  Intern paged this morning in regards to HR 40-50's.  Patient noted to be sleeping.  Patient was seen and evaluated, HR improved to 70's. She was sleeping but easily arousable.  Does not speak, but opens eyes to commands and occasionally moans.  Vitals reviewed. General: resting in bed, sleeping but easily arousable Cardiac: RRR Pulm: clear to auscultation bilaterally on anterior exam Ext: arms folded, +2 radial pulses Neuro: non-verbal, opens eyes to commands, follows with eyes, occasionally moans  Ms. Kaitlin Brennan is a 80 year old female with DM2 and basal cell carcinoma admitted for sepsis and hypovolemia and found to have 1/2 blood cultures positive for MRSA, NSTEMI, hypernatremia, and GIB.  Palliative care has been consulted.     -will continue to monitor HR and vitals for now -reviewed labs, Na down to 149 and K of 3.0 noted yesterday (3/1) late afternoon, will replace at this time. Platelets down to 138.  -f/u cmet, cbc, and add mag  Signed: Darden PalmerSamaya Johnavon Mcclafferty, MD PGY-2, Internal Medicine Resident Pager: [phone]  03/31/2013,3:25 AM

## 2013-03-31 NOTE — Progress Notes (Signed)
Pt. Is unresponsive and nursing asked to hold therapy. Pt. Family is deciding on hospice.

## 2013-03-31 NOTE — Progress Notes (Signed)
[facility]  Date of Admission:  03/26/2013  Antibiotics: Vancomycin day 6  Subjective: No acute events, palliative care meeting today at 11am  Objective: Temp:  [97.6 F (36.4 C)-99.2 F (37.3 C)] 97.7 F (36.5 C) (03/02 0734) Pulse Rate:  [71-92] 79 (03/02 0800) Resp:  [16-28] 22 (03/02 0800) BP: (101-128)/(36-64) 101/50 mmHg (03/02 0800) SpO2:  [93 %-100 %] 95 % (03/02 0800) Weight:  [142 lb 13.7 oz (64.8 kg)] 142 lb 13.7 oz (64.8 kg) (03/02 0344)  General: awake, confused Skin: multiple abrasions, left auricular with scab at site of carcinoma Lungs: CTA B Cor: RRR without m Abdomen: soft, nt, nd Ext: no edema  Lab Results Lab Results  Component Value Date   WBC 13.4* 03/31/2013   HGB 9.9* 03/31/2013   HCT 29.9* 03/31/2013   MCV 85.4 03/31/2013   PLT 150 03/31/2013    Lab Results  Component Value Date   CREATININE 0.81 03/31/2013   BUN 10 03/31/2013   NA 145 03/31/2013   K 3.0* 03/31/2013   CL 111 03/31/2013   CO2 24 03/31/2013    Lab Results  Component Value Date   ALT 71* 03/31/2013   AST 34 03/31/2013   ALKPHOS 100 03/31/2013   BILITOT 0.4 03/31/2013      Microbiology: Recent Results (from the past 240 hour(s))  CULTURE, BLOOD (ROUTINE X 2)     Status: None   Collection Time    03/26/13 12:01 PM      Result Value Ref Range Status   Specimen Description BLOOD LEFT ANTECUBITAL   Final   Special Requests BOTTLES DRAWN AEROBIC ONLY 5CC   Final   Culture  Setup Time     Final   Value: 03/26/2013 16:11     Performed at [facility]   Culture     Final   Value: METHICILLIN RESISTANT STAPHYLOCOCCUS AUREUS     Note: RIFAMPIN AND GENTAMICIN SHOULD NOT BE USED AS SINGLE DRUGS FOR TREATMENT OF STAPH INFECTIONS. This organism DOES NOT demonstrate inducible Clindamycin resistance in vitro.     Note: Gram Stain Report Called to,Read Back By and Verified With: [facility] [place] 03/27/13 9:00PM THOMI     Performed at [facility]   Report  Status 03/29/2013 FINAL   Final   Organism ID, Bacteria METHICILLIN RESISTANT STAPHYLOCOCCUS AUREUS   Final  URINE CULTURE     Status: None   Collection Time    03/26/13 12:25 PM      Result Value Ref Range Status   Specimen Description URINE, CATHETERIZED   Final   Special Requests NONE   Final   Culture  Setup Time     Final   Value: 03/26/2013 13:46     Performed at [facility]   Colony Count     Final   Value: NO GROWTH     Performed at [facility]   Culture     Final   Value: NO GROWTH     Performed at [facility]   Report Status 03/27/2013 FINAL   Final  CLOSTRIDIUM DIFFICILE BY PCR     Status: None   Collection Time    03/26/13  4:03 PM      Result Value Ref Range Status   C difficile by pcr NEGATIVE  NEGATIVE Final  MRSA PCR SCREENING     Status: Abnormal   Collection Time    03/26/13  8:02 PM  Result Value Ref Range Status   MRSA by PCR POSITIVE (*) NEGATIVE Final   Comment:            The GeneXpert MRSA Assay (FDA     approved for NASAL specimens     only), is one component of a     comprehensive MRSA colonization     surveillance program. It is not     intended to diagnose MRSA     infection nor to guide or     monitor treatment for     MRSA infections.     RESULT CALLED TO, READ BACK BY AND VERIFIED WITH:     Bridget HartshornM PIEL RN [id number] 03/26/13 A BROWNING  CULTURE, BLOOD (ROUTINE X 2)     Status: None   Collection Time    03/26/13  8:35 PM      Result Value Ref Range Status   Specimen Description BLOOD LEFT ARM   Final   Special Requests BOTTLES DRAWN AEROBIC ONLY 2CC   Final   Culture  Setup Time     Final   Value: 03/27/2013 01:17     [facility]formed at [facility]   Culture     Final   Value:        BLOOD CULTURE RECEIVED NO GROWTH TO DATE CULTURE WILL BE HELD FOR 5 DAYS BEFORE ISSUING A FINAL NEGATIVE REPORT     Performed at [facility]   Report Status PENDING   Incomplete  CULTURE, BLOOD (ROUTINE X 2)     Status: None    Collection Time    03/28/13  4:35 AM      Result Value Ref Range Status   Specimen Description BLOOD RIGHT FOOT   Final   Special Requests BOTTLES DRAWN AEROBIC ONLY 3CC   Final   Culture  Setup Time     Final   Value: 03/28/2013 08:29     Performed at [facility]   Culture     Final   Value:        BLOOD CULTURE RECEIVED NO GROWTH TO DATE CULTURE WILL BE HELD FOR 5 DAYS BEFORE ISSUING A FINAL NEGATIVE REPORT     Performed at [facility]   Report Status PENDING   Incomplete  CULTURE, BLOOD (ROUTINE X 2)     Status: None   Collection Time    03/28/13  4:40 AM      Result Value Ref Range Status   Specimen Description BLOOD RIGHT HAND   Final   Special Requests BOTTLES DRAWN AEROBIC ONLY 2CC   Final   Culture  Setup Time     Final   Value: 03/28/2013 08:29     Performed at [facility]   Culture     Final   Value:        BLOOD CULTURE RECEIVED NO GROWTH TO DATE CULTURE WILL BE HELD FOR 5 DAYS BEFORE ISSUING A FINAL NEGATIVE REPORT     Performed at [facility]   Report Status PENDING   Incomplete    Studies/Results: No results found.  Assessment/Plan: 1) MRSA bacteremia - on vancomycin and repeat blood cultures remain negative.  Meeting today for goals of care and will await their input.    Staci RighterOMER, ROBERT, MD [facility] [email or web address] [phone] pager   [phone] cell 03/31/2013, 9:08 AM

## 2013-03-31 NOTE — Progress Notes (Signed)
Pt in and out cathed for 450cc's yellow urine. Pt tolerated procedure well.

## 2013-03-31 NOTE — Progress Notes (Addendum)
CSW received voicemail from Kaitlin Brennan with palliative medicine team. Voicemail explained plan for discharge is residential hospice. CSW texted Kaitlin Brennan, rep with [employer], to evaluate pt and discuss bed availability. Rep to assess pt and get back with CSW. CSW called pt's son and left him a voicemail requesting a return call to discuss placement options and to get permission to send referrals to additional facilities.  Addendum: CSW called Kaitlin Brennan back and left a voicemail explaining CSW has called pt's son and left a voicemail concerning residential hospice placement. CSW asked that Kaitlin Brennan call CSW back at her earliest convenience, as Kaitlin Brennan could also take pt and have her at the facility on hospice services, as she has skilled needs (wound care), and Medicare would cover for her to go back to SNF with hospice care. CSW also asked in voicemail if Kaitlin Brennan knows which facilities pt's family would like, and let Kaitlin Brennan know CSW has made referral to [facility]. CSW waiting for return call from pt's son to discuss these options as well.  Addendum: CSW received call back from Kaitlin Brennan stating plan would be for residential hospice and it seems family not interested in return to [facility]. CSW asked where pt's family might like placement, and Kaitlin Brennan suggested [facility] might be best fit as pt's son lives in [place]. CSW explained CSW has made referral to [facility] with [facility], and will make referral to Hospice at [facility] after speaking with pt's son Kaitlin Brennan. CSW tried home number listed for Kaitlin Brennan, and spoke with him about discharge. Kaitlin Brennan explained he was just listening to [place] when CSW called the home number; Kaitlin Brennan went to the funeral home after palliative meeting. CSW provided support and talked with Kaitlin Brennan about how overwhelming this has been for him. CSW explained CSW has made referral to [facility], providing the address for the facility, and provided the address for  [facility]ospice at [place] [facility] [place] in [place]. CSW explained for now, CSW will make referral to Hospice at [facility] as this is best fit concerning geographic location, and CSW will update Kaitlin Brennan when CSW hears back from facilities. Kaitlin Brennan explained he was told pt might be transferring rooms, and CSW explained CSW sees an order to move to 6N, and CSW will update Kaitlin Brennan when pt has moved. Pt just transferred to 6N, and CSW called Hospice at [facility] and explained disposition and move to the 6th floor. Facility states they have one bed, and admissions was going to contact their RN, [name], who assesses for appropriateness to see if she could assess pt today or tomorrow morning. CSW explained pt's son Kaitlin Brennan is the contact person, and facility verified they have phone numbers for Kaitlin Brennan. Admissions states she will have [name] reach out to [place] to schedule something for tomorrow morning and if the facility still has a bed they will take pt tomorrow morning, as long as she is deemed safe to transfer. CSW called pt's son Kaitlin Brennan to update him, and Kaitlin Brennan asked if CSW can continue following the case and providing assistance, though pt has transferred to [facility]. CSW assured Kaitlin Brennan that CSW will continue to follow and provide support, and I will do everything I can to help with transition to Hospice at [facility] after they have met with pt/[name] tomorrow morning and verify she is safe for transfer.   [name], MSW, [facility] Clinical Social Worker [phone]

## 2013-03-31 NOTE — Progress Notes (Signed)
  Date: 03/31/2013  Patient name: Kaitlin Brennan  Medical record number: [id number]  Date of birth: 02-20-1921   This patient has been seen and the plan of care was discussed with the house staff. Please see their note for complete details. I concur with their findings with the following additions/corrections: Discussed case with her husband at bedside. She is able to speak to me but is confused to time, place. Denies pain. Repeat BCx without growth. Today is day # 6 of IV Vanc. Goals of care discussion today. Free H20 deficit is slightly worsened today. Will need to decide on oral feeds including water. PICC line in place. Family is interested in comfort care. Will need to address IV abx tx as well as oral feeds.  Jonah BlueAlejandro Quintavious Rinck, DO, FACP [facility] 03/31/2013, 2:06 PM

## 2013-03-31 NOTE — Progress Notes (Signed)
PT Cancellation Note  Patient Details Name: Kaitlin Brennan MRN: [id number] DOB: 1921/10/02   Cancelled Treatment:    Reason Eval/Treat Not Completed: Other (comment) (continue to await GOC prior to eval. Discussed with RN)   [name], [name] Beth 03/31/2013, 7:46 AM [name] [name], PT [phone]

## 2013-03-31 NOTE — Progress Notes (Signed)
SLP Cancellation Note  Patient Details Name: Danise MinaJosephine Life MRN: [id number] DOB: July 06, 1921   Cancelled treatment:       Reason Eval/Treat Not Completed: Other (comment) (Pt in process of transfer)   Tracie Lindbloom, Riley NearingBonnie Caroline 03/31/2013, 3:31 PM

## 2013-03-31 NOTE — Progress Notes (Signed)
Family meeting scheduled for 11AM with PMT provider.  Anderson MaltaElizabeth Golding, DO Palliative Medicine

## 2013-03-31 NOTE — Progress Notes (Signed)
Subjective: Patient seen and examined at the bedside this morning. Mental status is stable. She knows her name, but cannot tell me the year or the season or where she is. She follows commands. Family meeting is scheduled for 11 AM to discuss goals of care.  Objective: Vital signs in last 24 hours: Filed Vitals:   03/31/13 0400 03/31/13 0615 03/31/13 0734 03/31/13 0800  BP: 105/36 117/59 112/60 101/50  Pulse: 84 72 88 79  Temp:   97.7 F (36.5 C)   TempSrc:   Oral   Resp: 18 18 20 22   Height:      Weight:      SpO2: 100% 100% 100% 95%   Weight change:   Intake/Output Summary (Last 24 hours) at 03/31/13 1149 Last data filed at 03/31/13 1100  Gross per 24 hour  Intake 2295.83 ml  Output    950 ml  Net 1345.83 ml   General: resting in bed in NAD  HEENT: Moist mucus membranes, cancerous ulcerated lesion noted on her left neck infraauricular Cardiac: RRR, no rubs, murmurs or gallops  Pulm: clear to auscultation bilaterally, moving normal volumes of air  Abd: soft, nontender, nondistended  Ext: SCDs in place, no pedal edema, 2+ DPs. Forearms covered with soft bandages, scattered small erythematous skin lesions in upper arm and face (per records patient has history of basal cell carcinoma) Neuro: awake and alert, oriented x1, follows commands, moving all extremities   Lab Results: Basic Metabolic Panel:  Recent Labs Lab 03/31/13 0610 03/31/13 1000  NA 145 147  K 3.0* 3.0*  CL 111 113*  CO2 24 24  GLUCOSE 118* 116*  BUN 10 9  CREATININE 0.81 0.80  CALCIUM 7.4* 6.9*  MG 1.4* 1.4*   Liver Function Tests:  Recent Labs Lab 03/30/13 1606 03/31/13 0610  AST 32 34  ALT 86* 71*  ALKPHOS 85 100  BILITOT 0.4 0.4  PROT 4.4* 4.4*  ALBUMIN 1.4* 1.5*   CBC:  Recent Labs Lab 03/26/13 1200  03/30/13 1606 03/31/13 0610  WBC 31.7*  < > 13.5* 13.4*  NEUTROABS 28.8*  --   --   --   HGB 14.3  < > 9.8* 9.9*  HCT 44.6  < > 30.0* 29.9*  MCV 89.4  < > 87.0 85.4  PLT 201   < > 138* 150  < > = values in this interval not displayed. Cardiac Enzymes:  Recent Labs Lab 03/26/13 1725 03/26/13 2348 03/27/13 0542  TROPONINI 4.37* 10.17* 9.22*   CBG:  Recent Labs Lab 03/30/13 1139 03/30/13 1642 03/30/13 2000 03/30/13 2324 03/31/13 0343 03/31/13 0731  GLUCAP 103* 143* 77 111* 110* 114*   Hemoglobin A1C:  Recent Labs Lab 03/26/13 2028  HGBA1C 5.4   Urinalysis:  Recent Labs Lab 03/26/13 1225  COLORURINE AMBER*  LABSPEC 1.024  PHURINE 5.0  GLUCOSEU 100*  HGBUR NEGATIVE  BILIRUBINUR MODERATE*  KETONESUR 15*  PROTEINUR NEGATIVE  UROBILINOGEN 0.2  NITRITE NEGATIVE  LEUKOCYTESUR SMALL*   Micro Results: Recent Results (from the past 240 hour(s))  CULTURE, BLOOD (ROUTINE X 2)     Status: None   Collection Time    03/26/13 12:01 PM      Result Value Ref Range Status   Specimen Description BLOOD LEFT ANTECUBITAL   Final   Special Requests BOTTLES DRAWN AEROBIC ONLY 5CC   Final   Culture  Setup Time     Final   Value: 03/26/2013 16:11     Performed at [facility]   Culture     Final   Value: METHICILLIN RESISTANT STAPHYLOCOCCUS AUREUS     Note: RIFAMPIN AND GENTAMICIN SHOULD NOT BE USED AS SINGLE DRUGS FOR TREATMENT OF STAPH INFECTIONS. This organism DOES NOT demonstrate inducible Clindamycin resistance in vitro.     Note: Gram Stain Report Called to,Read Back By and Verified With: [facility] HANCOCK 03/27/13 9:00PM THOMI     Performed at [facility]   Report Status 03/29/2013 FINAL   Final   Organism ID, Bacteria METHICILLIN RESISTANT STAPHYLOCOCCUS AUREUS   Final  URINE CULTURE     Status: None   Collection Time    03/26/13 12:25 PM      Result Value Ref Range Status   Specimen Description URINE, CATHETERIZED   Final   Special Requests NONE   Final   Culture  Setup Time     Final   Value: 03/26/2013 13:46     Performed at [facility] Count     Final   Value: NO GROWTH     Performed at [facility]   Culture     Final   Value: NO GROWTH     Performed at [facility]   Report Status 03/27/2013 FINAL   Final  CLOSTRIDIUM DIFFICILE BY PCR     Status: None   Collection Time    03/26/13  4:03 PM      Result Value Ref Range Status   C difficile by pcr NEGATIVE  NEGATIVE Final  MRSA PCR SCREENING     Status: Abnormal   Collection Time    03/26/13  8:02 PM      Result Value Ref Range Status   MRSA by PCR POSITIVE (*) NEGATIVE Final   Comment:            The GeneXpert MRSA Assay (FDA     approved for NASAL specimens     only), is one component of a     comprehensive MRSA colonization     surveillance program. It is not     intended to diagnose MRSA     infection nor to guide or     monitor treatment for     MRSA infections.     RESULT CALLED TO, READ BACK BY AND VERIFIED WITH:     Bridget Hartshorn RN 2154 03/26/13 A BROWNING  CULTURE, BLOOD (ROUTINE X 2)     Status: None   Collection Time    03/26/13  8:35 PM      Result Value Ref Range Status   Specimen Description BLOOD LEFT ARM   Final   Special Requests BOTTLES DRAWN AEROBIC ONLY 2CC   Final   Culture  Setup Time     Final   Value: 03/27/2013 01:17     Performed at [facility]   Culture     Final   Value:        BLOOD CULTURE RECEIVED NO GROWTH TO DATE CULTURE WILL BE HELD FOR 5 DAYS BEFORE ISSUING A FINAL NEGATIVE REPORT     Performed at [facility]   Report Status PENDING   Incomplete  CULTURE, BLOOD (ROUTINE X 2)     Status: None   Collection Time    03/28/13  4:35 AM      Result Value Ref Range Status   Specimen Description BLOOD RIGHT FOOT   Final   Special Requests BOTTLES DRAWN AEROBIC ONLY 3CC  Final   Culture  Setup Time     Final   Value: 03/28/2013 08:29     Performed at [facility]   Culture     Final   Value:        BLOOD CULTURE RECEIVED NO GROWTH TO DATE CULTURE WILL BE HELD FOR 5 DAYS BEFORE ISSUING A FINAL NEGATIVE REPORT     Performed at [facility]    Report Status PENDING   Incomplete  CULTURE, BLOOD (ROUTINE X 2)     Status: None   Collection Time    03/28/13  4:40 AM      Result Value Ref Range Status   Specimen Description BLOOD RIGHT HAND   Final   Special Requests BOTTLES DRAWN AEROBIC ONLY 2CC   Final   Culture  Setup Time     Final   Value: 03/28/2013 08:29     Performed at [facility]   Culture     Final   Value:        BLOOD CULTURE RECEIVED NO GROWTH TO DATE CULTURE WILL BE HELD FOR 5 DAYS BEFORE ISSUING A FINAL NEGATIVE REPORT     Performed at [facility]   Report Status PENDING   Incomplete   Studies/Results: No results found. Medications: I have reviewed the patient's current medications. Scheduled Meds: . antiseptic oral rinse  15 mL Mouth Rinse BID  . chlorhexidine  15 mL Mouth Rinse BID  . insulin aspart  0-9 Units Subcutaneous 6 times per day  . LORazepam  0.25 mg Intravenous Once  . pantoprazole (PROTONIX) IV  40 mg Intravenous Q12H  . sodium chloride  1,000 mL Intravenous Once  . sodium chloride  10-40 mL Intracatheter Q12H  . sodium chloride  3 mL Intravenous Q12H  . vancomycin  1,000 mg Intravenous Q24H   Continuous Infusions: . sodium chloride 50 mL/hr (03/31/13 0859)   PRN Meds:.morphine injection, sodium chloride Assessment/Plan: 80 year old woman with DM22, basal cell carcinoma, right intertrochanteric femur fracture (s/p repair Dec 2014) who presents with AMS and hypotension.   Severe sepsis: She had tachycardia, tachypneic, WBC 31.7 on presentation and evidence of hypoperfusion with hypotension, lactic acid 14.25 and elevated creatinine, liver enzymes and troponin. Lactic acid improved to 2.0 with IVF. Uurine culture negative. She had 1 of 2 blood culture bottles growing MRSA from admission 2/25. She has multiple skin lesions and one with ulceration that could be the source of MRSA for her bacteremia. Repeat blood cx 2/27 are NGTD. - IV fluids normal saline at 50 cc per hour -  continue empiric antibiotics - Vancomycin   - Zofran prn  - Continue PICC--patient has poor peripheral venous access   GI bleed: FOBT + and two episodes of diarrhea in the ED (one with gross blood per nurse). Dark (but not coffee ground) emesis followed by several dark BMs. Hgb 14.3--> 10.2 after IV fluids. Patient was likely hemo-concentrated on admission. GI consulted no recommendation for further imaging or work up since her Hg has stabilized. Source of bleeding unclear but does not appear severe. Deferring endoscopy until other medical issues resolved and only if continued bleeding. Stool pathogen panel negative. Rectal tube in place. - Appreciate GI recs, would elect to treat this patient empirically with a 6 week course of proton pump inhibitor as long as no further hematemesis, melena, or hematochezia associated with significant further hemoglobin drop - IV protonix - NPO per SLP, her mental status may allow  for a better exam today  Hemoglobin  Date Value Ref Range Status  03/31/2013 9.9* 12.0 - 15.0 g/dL Final  02/04/1094 9.8* 04.5 - 15.0 g/dL Final  4/0/9811 91.4* 78.2 - 15.0 g/dL Final  9/56/2130 9.7* 86.5 - 15.0 g/dL Final  7/84/6962 9.4* 95.2 - 15.0 g/dL Final   Goals of care: Patient's son does not have HPOA but is the next of kin. His sister Melba Coon, who lives in [place], is in agreement with his medication decisions on behalf of their mother. Although the patient does not have an advance directive, the son reports that their father had one and at that time his mother had expressed that she would not want heroic measures.  - Consulted Palliative Care Medicine for goals of care meeting which is happening today at 11am  Hypernatremia: Na 156 on presentation, likely 2/2 to dehydration due to no intake per mouth as reported per her son. Also with N/V, diarrhea days before presentation. This has all likely led to her hypovolemia and presentation with hypotension, pre-renal azotemia and  ischemic hepatitis. Na and BP have improved after fluid resuscitation with D5W. No signs of volume overload on physical exam.  - Changing fluids to normal saline at 50 cc per hour - continue assessment for volume status - BMP in am if family wants (depends on goals of care)  Sodium  Date Value Ref Range Status  03/31/2013 147  137 - 147 mEq/L Final  03/31/2013 145  137 - 147 mEq/L Final  03/30/2013 149* 137 - 147 mEq/L Final  03/30/2013 154* 137 - 147 mEq/L Final  03/29/2013 155* 137 - 147 mEq/L Final  03/29/2013 155* 137 - 147 mEq/L Final  03/28/2013 159* 137 - 147 mEq/L Final  03/28/2013 144  137 - 147 mEq/L Final  03/28/2013 158* 137 - 147 mEq/L Final  03/27/2013 153* 137 - 147 mEq/L Final    AMS: Likely due to her hypernatremia and sepsis. Improved with therapies and D/c foley. - In and out cath prn  Hypokalemia - K = 3.0 overnight. Mag 1.4. S/p IV K x 4 and Mag sulfate 2g IV. Repeat potassium this morning is also 3 and Mag is still 1.4. - Repleting  IV K x 4 and Mag sulfate 2g IV again as we await goals of care decision - Repeat BMP and mag this afternoon if family wants (depends on goals of care)  Bradycardia: Occurred overnight to 40s-50s. Patient was sleeping and asymptomatic, arousable. Improved to 70s without intervention. - Treating electrolyte disturbances as above  Positive troponin: Trop 4.37--> 10.17--> 9.22. Likely 2/2 to demand ischemia. Spoke with cardiology and given GI bleed not a candidate for heparin or ASA.   DM type 2: Hgb A1c 7.04 Jan 2013. Home medication is metformin. Hgb stable 78-136 on SSI sensitive.  - continue SSI sensitive   Diet: Appreciate SLP eval. She is NPO except for meds with puree.    VTE ppx: SCDs only in the setting of GI bleed  Dispo: Disposition is deferred at this time, awaiting improvement of current medical problems.  Anticipated discharge in approximately 2-3 day(s).   The patient does not have a current PCP (No Pcp Per Patient)  and does need an [facility] hospital follow-up appointment after discharge.  The patient does not have transportation limitations that hinder transportation to clinic appointments.  .Services Needed at time of discharge: Y = Yes, Blank = No PT:   OT:   RN:   Equipment:  Other:     LOS: 5 days   Vivi Barrack, MD 03/31/2013, 11:49 AM  Vivi Barrack, MD  [email or web address] Pager # [phone] Office # [phone]

## 2013-03-31 NOTE — Consult Note (Signed)
Patient WU:JWJXBJYNW Crumby      DOB: 03-Jan-1922      GNF:[id number]     Consult Note from the Palliative Medicine Team at [facility]    Consult Requested by: Dr. Josem Kaufmann      PCP: No PCP Per Patient Reason for Consultation: GOC and options.    Phone Number:None  Assessment of patients Current state: Ms. Grillot is a 80 yo female admitted with sepsis MRSA bacteremia, hypernatremia, and altered mental status. She has PMH of right hip fracture and repair in December 2014 and her son Ethelene Browns tells me that she has declined since her surgery and has eaten very little since her surgery (Albumin 1.5). She has also not been participating in her rehabilitation therapy according to her son and he says he was led to believe that her overall health and well-being was better than it actually is by the therapists at [facility]. She has also had recurrent episodes of nausea/vomitting and diarrhea (even with limited intake) although he believes this might be to her recurrent UTI and antibiotic treatments. DNR is confirmed. Ethelene Browns says that he and his sister (who lives in [place]) have discussed their mother's health and that they have been unable to locate her Living Will. The family is very realistic and know that Ms. Ruacho will not live long in this state and they wish to focus on her comfort. They agree to transfer to hospice facility to focus on her comfort at end of life. We did complete MOST and Ethelene Browns decided DNR, full comfort measures (not to return to the hospital), consider the benefit of antibiotic therapy, no IVF after discharge, and no feeding tubes - a copy is placed in chart. I also completed golden DNR. Ethelene Browns agrees for the medical team to adjust and minimize her medications and no more lab draws for her comfort. I will continue to follow and support Ms. Geerdes and her family holistically.   Goals of Care: 1.  Code Status: DNR   2. Scope of Treatment: 1. Vital Signs:  routine 2. Respiratory/Oxygen: for comfort 3. Nutritional Support/Tube Feeds: no 4. Antibiotics: yes 5. IVF: while in hospital 6. Review of Medications to be discontinued: Minimize for comfort 7. Labs: no 8. Telemetry: no 9. Consults: CSW   4. Disposition: Hopeful for hospice facility.    3. Symptom Management:   1. Anxiety/Agitation: Ativan prn.  2. Pain: Morphine prn.  3. Fever: Acetaminophen supp prn.  4. Nausea/Vomiting: Ondansetron prn.   4. Psychosocial: Emotional support provided to patient and her son, Alinda Money.    Patient Documents Completed or Given: Document Given Completed  Advanced Directives Pkt    MOST  yes  DNR  yes  Gone from My Sight    Hard Choices      Brief HPI: 80 yo female with health decline since December 2014 after right hip fracture and repair. She was admitted and treated for sepsis MRSA bacteremia and hypernatremia likely r/t dehydration with N/V and diarrhea.    ROS: Denies pain, unable to elicit any further information d/t AMS.     PMH:  Past Medical History  Diagnosis Date  . Diabetes mellitus without complication   . Hypertension      PSH: Past Surgical History  Procedure Laterality Date  . Intramedullary (im) nail intertrochanteric Right 01/02/2013    Procedure: INTRAMEDULLARY (IM) NAIL INTERTROCHANTRIC RIGHT HIP;  Surgeon: Cheral Almas, MD;  Location: [facility] OR;  Service: Orthopedics;  Laterality: Right;   I have reviewed  the FH and SH and  If appropriate update it with new information. No Known Allergies Scheduled Meds: . antiseptic oral rinse  15 mL Mouth Rinse BID  . chlorhexidine  15 mL Mouth Rinse BID  . insulin aspart  0-9 Units Subcutaneous 6 times per day  . LORazepam  0.25 mg Intravenous Once  . magnesium sulfate 1 - 4 g bolus IVPB  2 g Intravenous Once  . pantoprazole (PROTONIX) IV  40 mg Intravenous Q12H  . potassium chloride  10 mEq Intravenous Q1 Hr x 4  . sodium chloride  1,000 mL Intravenous Once  . sodium  chloride  10-40 mL Intracatheter Q12H  . sodium chloride  3 mL Intravenous Q12H  . vancomycin  1,000 mg Intravenous Q24H   Continuous Infusions: . sodium chloride 50 mL/hr (03/31/13 0859)   PRN Meds:.LORazepam, morphine injection, sodium chloride    BP 101/50  Pulse 79  Temp(Src) 97.7 F (36.5 C) (Oral)  Resp 22  Ht 5' (1.524 m)  Wt 64.8 kg (142 lb 13.7 oz)  BMI 27.90 kg/m2  SpO2 95%   PPS: 30%   Intake/Output Summary (Last 24 hours) at 03/31/13 1214 Last data filed at 03/31/13 1100  Gross per 24 hour  Intake 2220.83 ml  Output    950 ml  Net 1270.83 ml   LBM: 03/31/13                        Physical Exam:  General: NAD, slightly restless at times HEENT: (Hx basal cell carcinoma) ulcerated lesion left infraauricular, moist mucous membranes, poor dentation Chest: CTA throughout, room air, no labored breathes Cardiac: RRR, S1 S2, no JVD Abdomen: Soft, NT, ND, +BS Ext: MAE, no BLE edema, bilat arms weeping and covered with drgs, lesions scattered over face and extremeties (hx basal cell carcinoma) Neuro: Arousable, oriented to person only, follows commands  Labs: CBC    Component Value Date/Time   WBC 13.4* 03/31/2013 0610   RBC 3.50* 03/31/2013 0610   HGB 9.9* 03/31/2013 0610   HCT 29.9* 03/31/2013 0610   PLT 150 03/31/2013 0610   MCV 85.4 03/31/2013 0610   MCH 28.3 03/31/2013 0610   MCHC 33.1 03/31/2013 0610   RDW 19.3* 03/31/2013 0610   LYMPHSABS 1.6 03/26/2013 1200   MONOABS 1.3* 03/26/2013 1200   EOSABS 0.0 03/26/2013 1200   BASOSABS 0.0 03/26/2013 1200    BMET    Component Value Date/Time   NA 147 03/31/2013 1000   K 3.0* 03/31/2013 1000   CL 113* 03/31/2013 1000   CO2 24 03/31/2013 1000   GLUCOSE 116* 03/31/2013 1000   BUN 9 03/31/2013 1000   CREATININE 0.80 03/31/2013 1000   CALCIUM 6.9* 03/31/2013 1000   GFRNONAA 63* 03/31/2013 1000   GFRAA 74* 03/31/2013 1000    CMP     Component Value Date/Time   NA 147 03/31/2013 1000   K 3.0* 03/31/2013 1000   CL 113* 03/31/2013 1000   CO2  24 03/31/2013 1000   GLUCOSE 116* 03/31/2013 1000   BUN 9 03/31/2013 1000   CREATININE 0.80 03/31/2013 1000   CALCIUM 6.9* 03/31/2013 1000   PROT 4.4* 03/31/2013 0610   ALBUMIN 1.5* 03/31/2013 0610   AST 34 03/31/2013 0610   ALT 71* 03/31/2013 0610   ALKPHOS 100 03/31/2013 0610   BILITOT 0.4 03/31/2013 0610   GFRNONAA 63* 03/31/2013 1000   GFRAA 74* 03/31/2013 1000     Time In Time Out Total  Time Spent with Patient Total Overall Time  1100 1230 75min 90min    Greater than 50%  of this time was spent counseling and coordinating care related to the above assessment and plan.  Yong ChannelAlicia Kilea Mccarey, NP Palliative Medicine Team Pager # [phone] (M-F 8a-5p) Team Phone # [phone] (Nights/Weekends)  Discussed with Dr. Claudell Kyleater.

## 2013-03-31 NOTE — Progress Notes (Addendum)
Clinical Social Work Department BRIEF PSYCHOSOCIAL ASSESSMENT 03/31/2013  Patient:  Danise MinaKINDRED,Isatu     Account Number:  [id number]     Admit date:  03/26/2013  Clinical Social Worker:  Varney BilesANDERSON,Rochelle Nephew, LCSWA  Date/Time:  03/31/2013 11:06 AM  Referred by:  Physician  Date Referred:  03/31/2013 Referred for  SNF placement  Residential hospice placement   Other Referral:   Interview type:  Family Other interview type:    PSYCHOSOCIAL DATA Living Status:  FACILITY Admitted from facility:  [facility] Level of care:  Skilled Nursing Facility Primary support name:  Melven Sartoriusnthony Krogh [phone]) Primary support relationship to patient:  CHILD, ADULT Degree of support available:   Good--pt's son involved in pt's care, and pt is a resident of [facility].    CURRENT CONCERNS Current Concerns  Post-Acute Placement   Other Concerns:    SOCIAL WORK ASSESSMENT / PLAN CSW called pt's son Ethelene Brownsnthony, as pt is disoriented x4 per the chart. CSW explained CSW role in discharge and that per pt's chart, she was at [facility] before hospitalization. Ethelene Brownsnthony states this is true but he is not sure if pt will be returning to SNF. CSW asked Ethelene Brownsnthony what he feels would be approrpriate discharge plan, and Ethelene Brownsnthony states he is meeting with MDs this morning at 11:00 to talk about disposition. Ethelene Brownsnthony believes it is possible they will recommend hospice. CSW explained that it could be possible for pt to return to [facility] with hospice, or if plan is residential hospice that CSW will help with this. CSW explained CSW will check back in with Ethelene Brownsnthony this afternoon after he has talked with MDs and has time to think about discharge. Ethelene Brownsnthony thanked CSW for helping with discharge plan, saying he greatly appreciates call from CSW.   Assessment/plan status:  Psychosocial Support/Ongoing Assessment of Needs Other assessment/ plan:   Information/referral to community resources:   SNF? Residential  hospice?    PATIENT'S/FAMILY'S RESPONSE TO PLAN OF CARE: Good--pt's son Ethelene Brownsnthony unsure about discharge plan at this time, but thanked CSW for call and thanked CSW for helping with planning. Ethelene BrownsAnthony meeting with MDs now to discuss disposition and CSW to check back in with him this afternoon to provide additional support and assistance with discharge.       Maryclare LabradorJulie Maanvi Lecompte, MSW, [facility] SurgeryCSWA Clinical Social Worker [phone]

## 2013-03-31 NOTE — Progress Notes (Addendum)
Paged Dr Marilu FavreQuershi and made her aware that pt's HR now 48-60. Pt asymptomatic. Instructed to call her for sustained rates  <55 or symptomatic.

## 2013-04-01 DIAGNOSIS — I959 Hypotension, unspecified: Secondary | ICD-10-CM

## 2013-04-01 DIAGNOSIS — D72829 Elevated white blood cell count, unspecified: Secondary | ICD-10-CM

## 2013-04-01 DIAGNOSIS — N179 Acute kidney failure, unspecified: Secondary | ICD-10-CM

## 2013-04-01 DIAGNOSIS — D649 Anemia, unspecified: Secondary | ICD-10-CM

## 2013-04-01 LAB — GLUCOSE, CAPILLARY: Glucose-Capillary: 88 mg/dL (ref 70–99)

## 2013-04-01 LAB — BASIC METABOLIC PANEL
BUN: 8 mg/dL (ref 6–23)
CHLORIDE: 113 meq/L — AB (ref 96–112)
CO2: 23 mEq/L (ref 19–32)
CREATININE: 0.83 mg/dL (ref 0.50–1.10)
Calcium: 7.1 mg/dL — ABNORMAL LOW (ref 8.4–10.5)
GFR calc Af Amer: 70 mL/min — ABNORMAL LOW (ref >90)
GFR calc non Af Amer: 61 mL/min — ABNORMAL LOW (ref >90)
Glucose, Bld: 89 mg/dL (ref 70–99)
Potassium: 3.7 mEq/L (ref 3.7–5.3)
Sodium: 144 mEq/L (ref 137–147)

## 2013-04-01 LAB — MAGNESIUM: Magnesium: 2.4 mg/dL (ref 1.5–2.5)

## 2013-04-01 MED ORDER — CHLORHEXIDINE GLUCONATE 0.12 % MT SOLN: 15.0000 mL | Freq: Two times a day (BID) | OROMUCOSAL | Status: AC

## 2013-04-01 MED ORDER — HEPARIN SOD (PORK) LOCK FLUSH 100 UNIT/ML IV SOLN
250.0000 [IU] | INTRAVENOUS | Status: DC | PRN
  Administered 2013-04-01: 11:00:00

## 2013-04-01 MED ORDER — ONDANSETRON HCL 4 MG/2ML IJ SOLN: 4.0000 mg | Freq: Four times a day (QID) | INTRAMUSCULAR | Status: AC | PRN

## 2013-04-01 MED ORDER — LORAZEPAM 2 MG/ML IJ SOLN: 0.2500 mg | INTRAMUSCULAR | Status: AC | PRN

## 2013-04-01 MED ORDER — MORPHINE SULFATE 2 MG/ML IJ SOLN: 1.0000 mg | INTRAMUSCULAR | Status: AC | PRN

## 2013-04-01 NOTE — Discharge Instructions (Signed)
It was a pleasure taking care of you. - This patient is comfort care only. No antibiotics, artifical feeding, fluids.  - Focus is on comfort and pain control at the end of her life. - Her son and decision-maker is Alinda Moneyony, cell is [phone].

## 2013-04-01 NOTE — Progress Notes (Signed)
Please clarify if SLP services are needed for assessment of best diet. Per chart pt is comfort care but no diet is ordered yet.  Thanks, Harlon DittyBonnie Domenic Schoenberger, MA CCC-SLP [phone]

## 2013-04-01 NOTE — Discharge Summary (Signed)
  Date: 04/01/2013  Patient name: Julieanne CottonJosephine Abate  Medical record number: [id number]  Date of birth: 1921/12/12   This patient has been seen and the plan of care was discussed with the house staff. Please see their note for complete details. I concur with their findings and plan.  Jonah BlueAlejandro Shley Dolby, DO, FACP Faculty [facility] 04/01/2013, 3:27 PM

## 2013-04-01 NOTE — Progress Notes (Signed)
OT Cancellation Note and Discharge  Patient Details Name: Danise MinaJosephine Soave MRN: [id number] DOB: 1921/05/14   Cancelled Treatment:     Pt is now comfort care, acute OT will sign off.  Evette GeorgesLeonard, Roniesha Hollingshead Eva [phone] 04/01/2013, 7:09 AM

## 2013-04-01 NOTE — Progress Notes (Signed)
Patient discharged to [facility], report given to nurse Colette RibasJoann Scott.

## 2013-04-01 NOTE — Progress Notes (Addendum)
CSW received call from residential hospice evaluating RN Lafonda MossesDiana stating she and pt's son have a meeting scheduled for today at 10am. CSW will come to pt's room at this time to provide support and assistance with discharge.  Addendum: CSW went to pt's bedside and spoke at length with pt's son. Son Ethelene Brownsnthony explained his father died almost 30 years ago from cancer. He was diagnosed in October and died a few months later. Ethelene Brownsnthony states he and his father lived each day to its fullest, going on vacations, golfing, and indulging in experiences they would not otherwise have had if his father had not been diagnosed. Ethelene Brownsnthony reflected on this experience positively, and CSW provided support and active listening as Ethelene Brownsnthony described his relationship with his father, and raising his own children and grandchildren. Ethelene Brownsnthony explained he and his wife moved to [place] about 5 years ago, and they moved pt about 3 years ago. Pt had some evidence of cognitive decline, but she was living independently when she fell in December. Ethelene Brownsnthony says his mother's surgery went well, but the effects of the anaesthesia were detrimental and pt has never fully recovered. Pt is more responsive today, and Ethelene Brownsnthony says she still recognizes him and he is grateful for this. CSW provided emotional support to pt, including brief grief counseling. Lafonda Mossesiana, RN with [facility], came to see pt and states they have a bed and can take her today. CSW spoke with MD, who is working on the discharge summary now, and informed RN, who asked PTAR be scheduled for 11:30am. CSW has scheduled this and placed medical necessity form on the front of pt's chart. Pt discharging this morning; CSW signing off.   Maryclare LabradorJulie Delano Frate, MSW, [facility] PcCSWA Clinical Social Worker [phone]

## 2013-04-01 NOTE — Progress Notes (Signed)
Subjective: Patient seen and examined at the bedside this morning. Mental status is stable. She knows her name, but cannot tell me the year or the season or where she is. She follows commands. The rep from [facility] will be meeting with the family at 10 AM in anticipation of transfer today.  Objective: Vital signs in last 24 hours: Filed Vitals:   03/31/13 1246 03/31/13 1600 03/31/13 2131 04/01/13 0522  BP: 119/53 133/52 130/49 121/52  Pulse: 80 81 88 82  Temp: 98.6 F (37 C) 98.4 F (36.9 C) 97.9 F (36.6 C) 97.2 F (36.2 C)  TempSrc: Axillary Oral Oral Oral  Resp: 19 14 15 16   Height:      Weight:  149 lb 14.6 oz (68 kg)    SpO2: 97% 99% 92% 94%   Weight change: 7 lb 0.9 oz (3.2 kg)  Intake/Output Summary (Last 24 hours) at 04/01/13 0757 Last data filed at 04/01/13 0600  Gross per 24 hour  Intake 1950.83 ml  Output    775 ml  Net 1175.83 ml   General: resting in bed in NAD  HEENT: Moist mucus membranes, cancerous ulcerated lesion noted on her left neck infraauricular Cardiac: RRR, no rubs, murmurs or gallops  Pulm: clear to auscultation bilaterally, moving normal volumes of air  Abd: soft, nontender, nondistended  Ext: SCDs in place, no pedal edema, 2+ DPs. Forearms covered with soft bandages, scattered small erythematous skin lesions in upper arm and face (per records patient has history of basal cell carcinoma) Neuro: awake and alert, oriented x1, follows commands, moving all extremities   Lab Results: Basic Metabolic Panel:  Recent Labs Lab 03/31/13 1000 03/31/13 2240  NA 147 144  K 3.0* 3.7  CL 113* 113*  CO2 24 23  GLUCOSE 116* 89  BUN 9 8  CREATININE 0.80 0.83  CALCIUM 6.9* 7.1*  MG 1.4* 2.4   Liver Function Tests:  Recent Labs Lab 03/30/13 1606 03/31/13 0610  AST 32 34  ALT 86* 71*  ALKPHOS 85 100  BILITOT 0.4 0.4  PROT 4.4* 4.4*  ALBUMIN 1.4* 1.5*   CBC:  Recent Labs Lab 03/26/13 1200  03/30/13 1606  03/31/13 0610  WBC 31.7*  < > 13.5* 13.4*  NEUTROABS 28.8*  --   --   --   HGB 14.3  < > 9.8* 9.9*  HCT 44.6  < > 30.0* 29.9*  MCV 89.4  < > 87.0 85.4  PLT 201  < > 138* 150  < > = values in this interval not displayed. Cardiac Enzymes:  Recent Labs Lab 03/26/13 1725 03/26/13 2348 03/27/13 0542  TROPONINI 4.37* 10.17* 9.22*   CBG:  Recent Labs Lab 03/31/13 0343 03/31/13 0731 03/31/13 1249 03/31/13 1605 03/31/13 1935 04/01/13 0005  GLUCAP 110* 114* 98 104* 99 88   Hemoglobin A1C:  Recent Labs Lab 03/26/13 2028  HGBA1C 5.4   Urinalysis:  Recent Labs Lab 03/26/13 1225  COLORURINE AMBER*  LABSPEC 1.024  PHURINE 5.0  GLUCOSEU 100*  HGBUR NEGATIVE  BILIRUBINUR MODERATE*  KETONESUR 15*  PROTEINUR NEGATIVE  UROBILINOGEN 0.2  NITRITE NEGATIVE  LEUKOCYTESUR SMALL*   Micro Results: Recent Results (from the past 240 hour(s))  CULTURE, BLOOD (ROUTINE X 2)     Status: None   Collection Time    03/26/13 12:01 PM      Result Value Ref Range Status   Specimen Description BLOOD LEFT ANTECUBITAL   Final   Special Requests BOTTLES DRAWN AEROBIC  ONLY 5CC   Final   Culture  Setup Time     Final   Value: 03/26/2013 16:11     Performed at [facility]   Culture     Final   Value: METHICILLIN RESISTANT STAPHYLOCOCCUS AUREUS     Note: RIFAMPIN AND GENTAMICIN SHOULD NOT BE USED AS SINGLE DRUGS FOR TREATMENT OF STAPH INFECTIONS. This organism DOES NOT demonstrate inducible Clindamycin resistance in vitro.     Note: Gram Stain Report Called to,Read Back By and Verified With: [facility] HANC[place] 03/27/13 9:00PM THOMI     Performed at [facility]   Report Status 03/29/2013 FINAL   Final   Organism ID, Bacteria METHICILLIN RESISTANT STAPHYLOCOCCUS AUREUS   Final  URINE CULTURE     Status: None   Collection Time    03/26/13 12:25 PM      Result Value Ref Range Status   Specimen Description URINE, CATHETERIZED   Final   Special Requests NONE   Final   Culture   Setup Time     Final   Value: 03/26/2013 13:46     Performed at [facility] Count     Final   Value: NO GROWTH     Performed at [facility]   Culture     Final   Value: NO GROWTH     Performed at [facility]   Report Status 03/27/2013 FINAL   Final  CLOSTRIDIUM DIFFICILE BY PCR     Status: None   Collection Time    03/26/13  4:03 PM      Result Value Ref Range Status   C difficile by pcr NEGATIVE  NEGATIVE Final  MRSA PCR SCREENING     Status: Abnormal   Collection Time    03/26/13  8:02 PM      Result Value Ref Range Status   MRSA by PCR POSITIVE (*) NEGATIVE Final   Comment:            The GeneXpert MRSA Assay (FDA     approved for NASAL specimens     only), is one component of a     comprehensive MRSA colonization     surveillance program. It is not     intended to diagnose MRSA     infection nor to guide or     monitor treatment for     MRSA infections.     RESULT CALLED TO, READ BACK BY AND VERIFIED WITH:     Bridget Hartshorn RN 2154 03/26/13 A BROWNING  CULTURE, BLOOD (ROUTINE X 2)     Status: None   Collection Time    03/26/13  8:35 PM      Result Value Ref Range Status   Specimen Description BLOOD LEFT ARM   Final   Special Requests BOTTLES DRAWN AEROBIC ONLY 2CC   Final   Culture  Setup Time     Final   Value: 03/27/2013 01:17     Performed at [facility]   Culture     Final   Value:        BLOOD CULTURE RECEIVED NO GROWTH TO DATE CULTURE WILL BE HELD FOR 5 DAYS BEFORE ISSUING A FINAL NEGATIVE REPORT     Performed at [facility]   Report Status PENDING   Incomplete  CULTURE, BLOOD (ROUTINE X 2)     Status: None   Collection Time    03/28/13  4:35 AM  Result Value Ref Range Status   Specimen Description BLOOD RIGHT FOOT   Final   Special Requests BOTTLES DRAWN AEROBIC ONLY 3CC   Final   Culture  Setup Time     Final   Value: 03/28/2013 08:29     Performed at [facility]   Culture     Final   Value:         BLOOD CULTURE RECEIVED NO GROWTH TO DATE CULTURE WILL BE HELD FOR 5 DAYS BEFORE ISSUING A FINAL NEGATIVE REPORT     Performed at [facility]   Report Status PENDING   Incomplete  CULTURE, BLOOD (ROUTINE X 2)     Status: None   Collection Time    03/28/13  4:40 AM      Result Value Ref Range Status   Specimen Description BLOOD RIGHT HAND   Final   Special Requests BOTTLES DRAWN AEROBIC ONLY 2CC   Final   Culture  Setup Time     Final   Value: 03/28/2013 08:29     Performed at [facility]   Culture     Final   Value:        BLOOD CULTURE RECEIVED NO GROWTH TO DATE CULTURE WILL BE HELD FOR 5 DAYS BEFORE ISSUING A FINAL NEGATIVE REPORT     Performed at [facility]   Report Status PENDING   Incomplete   Studies/Results: No results found. Medications: I have reviewed the patient's current medications. Scheduled Meds: . antiseptic oral rinse  15 mL Mouth Rinse BID  . chlorhexidine  15 mL Mouth Rinse BID  . LORazepam  0.25 mg Intravenous Once  . pantoprazole (PROTONIX) IV  40 mg Intravenous Q12H  . sodium chloride  1,000 mL Intravenous Once  . sodium chloride  10-40 mL Intracatheter Q12H  . sodium chloride  3 mL Intravenous Q12H  . vancomycin  1,000 mg Intravenous Q24H   Continuous Infusions: . sodium chloride 50 mL/hr (03/31/13 0859)   PRN Meds:.acetaminophen, LORazepam, morphine injection, ondansetron (ZOFRAN) IV, sodium chloride Assessment/Plan: 80 year old woman with DM22, basal cell carcinoma, right intertrochanteric femur fracture (s/p repair Dec 2014) who presents with AMS and hypotension.   Failure to thrive in an adult - She has PMH of right hip fracture and repair in December 2014 and her son Ethelene Browns tells me that she has declined since her surgery and has eaten very little since her surgery (Albumin 1.5). She has also not been participating in her rehabilitation therapy according to her son and he says he was led to believe that her overall  health and well-being was better than it actually is by the therapists at [facility]. She has also had recurrent episodes of nausea/vomitting and diarrhea (even with limited intake) although he believes this might be to her recurrent UTI and antibiotic treatments. DNR is confirmed. Ethelene Browns says that he and his sister (who lives in [place]) have discussed their mother's health and that they have been unable to locate her Living Will. The family is very realistic and know that Ms. Elenbaas will not live long in this state and they wish to focus on her comfort. They agree to transfer to hospice facility to focus on her comfort at end of life. We did complete MOST and Ethelene Browns decided DNR, full comfort measures (not to return to the hospital), consider the benefit of antibiotic therapy, no IVF after discharge, and no feeding tubes - a copy is placed in chart. I  also completed golden DNR. Ethelene Browns agrees for the medical team to adjust and minimize her medications and no more lab draws for her comfort.  - Discharge to residential hospice today  Severe sepsis: She had tachycardia, tachypneic, WBC 31.7 on presentation and evidence of hypoperfusion with hypotension, lactic acid 14.25 and elevated creatinine, liver enzymes and troponin. Lactic acid improved to 2.0 with IVF. Uurine culture negative. She had 1 of 2 blood culture bottles growing MRSA from admission 2/25. She has multiple skin lesions and one with ulceration that could be the source of MRSA for her bacteremia. Repeat blood cx 2/27 are NGTD. - IV fluids normal saline at 50 cc per hour, discontinue at discharge per family's wishes - continue empiric antibiotics - Vancomycin, discontinue at discharge per family's wishes - Zofran prn  - Continue PICC--patient has poor peripheral venous access   GI bleed: FOBT + and two episodes of diarrhea in the ED (one with gross blood per nurse). Dark (but not coffee ground) emesis followed by several dark BMs. Hgb 14.3-->  10.2 after IV fluids. Patient was likely hemo-concentrated on admission. GI consulted no recommendation for further imaging or work up since her Hg has stabilized. Source of bleeding unclear but does not appear severe. Deferring endoscopy until other medical issues resolved and only if continued bleeding. Stool pathogen panel negative. Rectal tube in place. - GI consulted, would elect to treat this patient empirically with a proton pump inhibitor and no intervention given comfort care measures.  Hemoglobin  Date Value Ref Range Status  03/31/2013 9.9* 12.0 - 15.0 g/dL Final  04/05/1060 9.8* 69.4 - 15.0 g/dL Final  09/03/4625 03.5* 00.9 - 15.0 g/dL Final  3/81/8299 9.7* 37.1 - 15.0 g/dL Final  6/96/7893 9.4* 81.0 - 15.0 g/dL Final   Hypernatremia: Na 156 on presentation, likely 2/2 to dehydration due to no intake per mouth as reported per her son. Also with N/V, diarrhea days before presentation. This has all likely led to her hypovolemia and presentation with hypotension, pre-renal azotemia and ischemic hepatitis. Na and BP have improved after fluid resuscitation with D5W. No signs of volume overload on physical exam.  - Normal saline at 50 cc per hour - Continue assessment for volume status - No further monitoring via blood draws  Sodium  Date Value Ref Range Status  03/31/2013 144  137 - 147 mEq/L Final  03/31/2013 147  137 - 147 mEq/L Final  03/31/2013 145  137 - 147 mEq/L Final  03/30/2013 149* 137 - 147 mEq/L Final  03/30/2013 154* 137 - 147 mEq/L Final  03/29/2013 155* 137 - 147 mEq/L Final  03/29/2013 155* 137 - 147 mEq/L Final  03/28/2013 159* 137 - 147 mEq/L Final  03/28/2013 144  137 - 147 mEq/L Final  03/28/2013 158* 137 - 147 mEq/L Final    AMS: Likely due to her hypernatremia and sepsis. Improved with therapies and D/c foley. - In and out cath prn  Hypokalemia, resolved - K=3.7 this morning, magnesium 2.4.  Bradycardia: resolved.  Positive troponin: Trop 4.37--> 10.17--> 9.22. Likely 2/2  to demand ischemia. Spoke with cardiology and given GI bleed not a candidate for heparin or ASA.   DM type 2: Hgb A1c 7.04 Jan 2013. Home medication is metformin. Hgb stable 78-136 on SSI sensitive.  - continue SSI sensitive while here  Diet: Appreciate SLP eval. She is NPO except for meds with puree.    VTE ppx: SCDs only in the setting of GI bleed  Dispo: Disposition  is deferred at this time, awaiting improvement of current medical problems.  Anticipated discharge in approximately 2-3 day(s).   The patient does not have a current PCP (No Pcp Per Patient) and does need an [facility] hospital follow-up appointment after discharge.  The patient does not have transportation limitations that hinder transportation to clinic appointments.  .Services Needed at time of discharge: Y = Yes, Blank = No PT:   OT:   RN:   Equipment:   Other:     LOS: 6 days   Vivi Barrack, MD 04/01/2013, 7:57 AM  Vivi Barrack, MD  [email or web address] Pager # [phone] Office # [phone]

## 2013-04-01 NOTE — Progress Notes (Signed)
Palliative care discussion noted. For comfort care and hospice.  I will sign off, please call for any changes.  Thanks  Staci RighterOMER, Hoke Baer, MD

## 2013-04-01 NOTE — Discharge Summary (Signed)
Name: Kaitlin Brennan MRN: [id number] DOB: 01-Jan-1922 80 y.o. PCP: No Pcp Per Patient  Date of Admission: 03/26/2013 11:51 AM Date of Discharge: 04/01/2013 Attending Physician: Jonah Blue, DO  Discharge Diagnosis: Principal Problem:   MRSA bacteremia Active Problems:   Leucocytosis   Diabetes mellitus, type 2   Sepsis associated hypotension   Protein-calorie malnutrition, severe   Acute renal insufficiency   GI bleed   Pelvic fracture   Normocytic anemia   Palliative care encounter   DNR (do not resuscitate)  Discharge Medications:   Medication List    ASK your doctor about these medications       calcium-vitamin D 500-200 MG-UNIT per tablet  Commonly known as:  OSCAL WITH D  Take 1 tablet by mouth daily with breakfast.     ferrous sulfate 325 (65 FE) MG tablet  Take 325 mg by mouth daily with breakfast.     lactobacillus acidophilus Tabs tablet  Take 1 tablet by mouth 2 (two) times daily.     LORazepam 0.5 MG tablet  Commonly known as:  ATIVAN  Take 0.5 mg by mouth every 6 (six) hours as needed for anxiety.     Magnesium Oxide 400 MG Caps  Take 400 mg by mouth at bedtime.     metFORMIN 500 MG tablet  Commonly known as:  GLUCOPHAGE  Take 500 mg by mouth daily with breakfast.     mirtazapine 15 MG tablet  Commonly known as:  REMERON  Take 15 mg by mouth at bedtime.     nystatin cream  Commonly known as:  MYCOSTATIN  Apply 1 application topically 3 (three) times daily. Vaginal area     promethazine 25 MG tablet  Commonly known as:  PHENERGAN  Take 25 mg by mouth daily as needed for nausea or vomiting.     senna 8.6 MG Tabs tablet  Commonly known as:  SENOKOT  Take 1 tablet (8.6 mg total) by mouth 2 (two) times daily.     VITAMIN B-12 IJ  Inject 1,000 mcg as directed every 30 (thirty) days.     Vitamin D (Ergocalciferol) 50000 UNITS Caps capsule  Commonly known as:  DRISDOL  Take 1 capsule (50,000 Units total) by mouth every 7 (seven) days.        Disposition and follow-up:   Ms.Kaitlin Brennan was discharged from [facility] in Stable condition.  At the hospital follow up visit please address:  1.  Comfort care only. No artificial feeding. No antibiotics or fluids.  2.  Labs / imaging needed at time of follow-up: None  3.  Pending labs/ test needing follow-up: None  Follow-up Appointments:   Discharge Instructions:   Consultations: Treatment Team:  Vertell Novak., MD Barrie Folk, MD [facility]  Procedures Performed:  Dg Chest Port 1 View  03/26/2013   CLINICAL DATA:  Shortness of breath and weakness  EXAM: PORTABLE CHEST - 1 VIEW  COMPARISON:  01/02/2013  FINDINGS: Fine and coarse interstitial opacities diffusely. Lung volumes appear decreased. No cardiomegaly. Limited assessment of the hila and upper mediastinal contours due to rightward rotation. No evidence of acute superimposed disease, including edema or consolidation. No effusion or pneumothorax.  IMPRESSION: Interstitial lung disease without evidence of acute superimposed process.   Electronically Signed   By: Tiburcio Pea M.D.   On: 03/26/2013 13:27    Admission HPI:  Ms. Kaitlin Brennan is a 80 year old with a PMH of DM type 2, basal cell  carcinoma, right intertrochanteric femur fracture (s/p repair Dec 2014). The patient presents from SNF with AMS. When EMS arrived her BP was 60/40. Son provides most of the history. She was independent up until hip fracture 3 months ago. After surgery she was discharged to SNF where she has lived since. Her son reports periods of confusion, decreased po and N/V for the past month. He visit with her daily and notes she has good and bad days. He also notes that she has been treated with antibiotics for frequent UTIs recently. She was most recently treated 2 weeks ago. He reports increasing lethargy in past few days. She has not had diarrhea as far as he knows.  In the ED: T 98.20F, RR 18-33, SpO2 100%  on 2L, HR 100-113, BP 74/56; she received 2L NSS, Vancomycin, Zosyn and Zofran. She had episode of dark emesis and bloody diarrhea in the ED.  Physical Exam:  Blood pressure 108/51, pulse 100, temperature 97.5 F (36.4 C), temperature source Axillary, resp. rate 22, height 5' (1.524 m), weight 60.5 kg (133 lb 6.1 oz), SpO2 100.00%.  General: resting in bed, NAD  HEENT: PERRL, EOMI  Cardiac: RRR, no rubs, murmurs or gallops; hands and feet cool to touch  Pulm: clear to auscultation bilaterally, moving normal volumes of air  Abd: soft, nondistended, BS present, + mild diffuse tenderness  Ext: warm and well perfused, no pedal edema  Skin: Several crusted lesions (basal cell CA history) on right neck, face and legs  Neuro: alert and oriented X3, cranial nerves II-XII grossly intact, following commands, opens eyes to voice, some verbal responses but otherwise nodding yes or no   Hospital Course by problem list: 80 year old woman with DM22, basal cell carcinoma, right intertrochanteric femur fracture (s/p repair Dec 2014) who presents with AMS and hypotension.   1. Severe sepsis with MRSA bacteremia - She had tachycardia, tachypneic, WBC 31.7 on presentation and evidence of hypoperfusion with hypotension, lactic acid 14.25 and elevated creatinine, liver enzymes and troponin. Lactic acid improved to 2.0 with IVF. Uurine culture negative. She had 1 of 2 blood culture bottles growing MRSA from admission 2/25. She has multiple skin lesions and one with ulceration that could be the source of MRSA for her bacteremia. Repeat blood cx 2/27 are NGTD. We gave IV fluids and empiric antibiotics - Vancomycin. Patient has a PICC--patient has poor peripheral venous access. Her vital signs stabilized with IVF resuscitation and antibiotics. Patient was made comfort care only on 3/2, see below.  2. Failure to thrive in an adult - She has PMH of right hip fracture and repair in December 2014 and her son Ethelene Browns tells me  that she has declined since her surgery and has eaten very little since her surgery (Albumin 1.5). She has also not been participating in her rehabilitation therapy according to her son and he says he was led to believe that her overall health and well-being was better than it actually is by the therapists at [facility]. She has also had recurrent episodes of nausea/vomitting and diarrhea (even with limited intake) although he believes this might be to her recurrent UTI and antibiotic treatments. DNR is confirmed. Ethelene Browns says that he and his sister (who lives in [place]) have discussed their mother's health and that they have been unable to locate her Living Will. The family is very realistic and know that Ms. Cutsforth will not live long in this state and they wish to focus on her comfort. They agree  to transfer to hospice facility to focus on her comfort at end of life. We did complete MOST and Ethelene Brownsnthony decided DNR, full comfort measures (not to return to the hospital), consider the benefit of antibiotic therapy, no IVF after discharge, and no feeding tubes - a copy is placed in chart. I also completed golden DNR. Ethelene Brownsnthony agrees for the medical team to adjust and minimize her medications and no more lab draws for her comfort. She was discharged to residential hospice on 3/3.  3. GI bleed - FOBT + and two episodes of diarrhea in the ED (one with gross blood per nurse). Dark (but not coffee ground) emesis followed by several dark BMs. Hgb 14.3--> 10.2 after IV fluids. Patient was likely hemo-concentrated on admission. GI consulted, would elect to treat this patient empirically with a proton pump inhibitor and no intervention given comfort care measures.  Hemoglobin   Date  Value  Ref Range  Status   03/31/2013  9.9*  12.0 - 15.0 g/dL  Final   1/6/10963/01/2013  9.8*  12.0 - 15.0 g/dL  Final   0/4/54093/01/2013  81.110.2*  12.0 - 15.0 g/dL  Final   9/14/78292/28/2015  9.7*  12.0 - 15.0 g/dL  Final   5/62/13082/28/2015  9.4*  12.0 - 15.0 g/dL  Final      4. Hypernatremia - Na 156 on presentation, likely 2/2 to dehydration due to no intake per mouth as reported per her son. Also with N/V, diarrhea days before presentation. This has all likely led to her hypovolemia and presentation with hypotension, pre-renal azotemia and ischemic hepatitis. Na and BP improved after fluid resuscitation with D5W. No signs of volume overload on physical exam. We changed fluids to normal saline at 50 cc per hour prior to discharge but this was discontinued per family wishes upon discharge to residential hospice.  Sodium   Date  Value  Ref Range  Status   03/31/2013  147  137 - 147 mEq/L  Final   03/31/2013  145  137 - 147 mEq/L  Final   03/30/2013  149*  137 - 147 mEq/L  Final   03/30/2013  154*  137 - 147 mEq/L  Final   03/29/2013  155*  137 - 147 mEq/L  Final   03/29/2013  155*  137 - 147 mEq/L  Final   03/28/2013  159*  137 - 147 mEq/L  Final   03/28/2013  144  137 - 147 mEq/L  Final   03/28/2013  158*  137 - 147 mEq/L  Final   03/27/2013  153*  137 - 147 mEq/L  Final    5. AMS - Likely due to her hypernatremia and sepsis. Improved with therapies and D/c foley. Alert and oriented x1. She may have underlying vascular dementia or AD contributing to AMS and failure to thrive.  6. Positive troponin - Trop 4.37--> 10.17--> 9.22. Likely 2/2 to demand ischemia. Spoke with cardiology and given GI bleed not a candidate for heparin or ASA. Denies chest pain.  7. DM type 2 - Hgb A1c 7.04 Jan 2013. Home medication is metformin. CBG was stable 78-136 on SSI sensitive.   8. Diet - SLP evaluated her. She is NPO except for meds with puree. Family would not like to pursue artifical feeding or feeding tube.   Discharge Vitals:   BP 121/52  Pulse 82  Temp(Src) 97.2 F (36.2 C) (Oral)  Resp 16  Ht 5' (1.524 m)  Wt 149 lb 14.6 oz (68 kg)  BMI 29.28 kg/m2  SpO2 94%  Discharge Labs:  Results for orders placed during the hospital encounter of 03/26/13 (from the past 24 hour(s))    GLUCOSE, CAPILLARY     Status: None   Collection Time    03/31/13 12:49 PM      Result Value Ref Range   Glucose-Capillary 98  70 - 99 mg/dL  GLUCOSE, CAPILLARY     Status: Abnormal   Collection Time    03/31/13  4:05 PM      Result Value Ref Range   Glucose-Capillary 104 (*) 70 - 99 mg/dL  GLUCOSE, CAPILLARY     Status: None   Collection Time    03/31/13  7:35 PM      Result Value Ref Range   Glucose-Capillary 99  70 - 99 mg/dL  BASIC METABOLIC PANEL     Status: Abnormal   Collection Time    03/31/13 10:40 PM      Result Value Ref Range   Sodium 144  137 - 147 mEq/L   Potassium 3.7  3.7 - 5.3 mEq/L   Chloride 113 (*) 96 - 112 mEq/L   CO2 23  19 - 32 mEq/L   Glucose, Bld 89  70 - 99 mg/dL   BUN 8  6 - 23 mg/dL   Creatinine, Ser 1.61  0.50 - 1.10 mg/dL   Calcium 7.1 (*) 8.4 - 10.5 mg/dL   GFR calc non Af Amer 61 (*) >90 mL/min   GFR calc Af Amer 70 (*) >90 mL/min  MAGNESIUM     Status: None   Collection Time    03/31/13 10:40 PM      Result Value Ref Range   Magnesium 2.4  1.5 - 2.5 mg/dL  GLUCOSE, CAPILLARY     Status: None   Collection Time    04/01/13 12:05 AM      Result Value Ref Range   Glucose-Capillary 88  70 - 99 mg/dL    Signed: Vivi Barrack, MD 04/01/2013, 10:18 AM   Time Spent on Discharge: 35 minutes Services Ordered on Discharge: None Equipment Ordered on Discharge: None

## 2013-04-01 NOTE — Progress Notes (Signed)
PT Cancellation and Discharge Note  Patient Details Name: Danise MinaJosephine Winkleman MRN: [id number] DOB: 1921-04-08   Cancelled Treatment:    Reason Eval/Treat Not Completed: PT screened, no needs identified, will sign off.  Noted plan is for Comfort Care and transfer to Hospice facility.  Will sign off acute PT.     Rama Mcclintock, Alison MurrayMegan F 04/01/2013, 7:35 AM

## 2013-04-02 LAB — CULTURE, BLOOD (ROUTINE X 2): Culture: NO GROWTH

## 2013-04-03 LAB — CULTURE, BLOOD (ROUTINE X 2)
CULTURE: NO GROWTH
Culture: NO GROWTH

## 2013-04-15 NOTE — Consult Note (Signed)
I have reviewed and discussed the care of this patient in detail with the nurse practitioner including pertinent patient records, physical exam findings and data. I agree with details of this encounter.  

## 2013-04-30 DEATH — deceased

## 2014-05-07 IMAGING — CR DG HIP COMPLETE 2+V*R*
3 series · 3 of 3 positions shown · non-contrast
Comparison: None.

CLINICAL DATA: Fall with right hip pain.

EXAM:
RIGHT HIP - COMPLETE 2+ VIEW

[t pelvis ap]
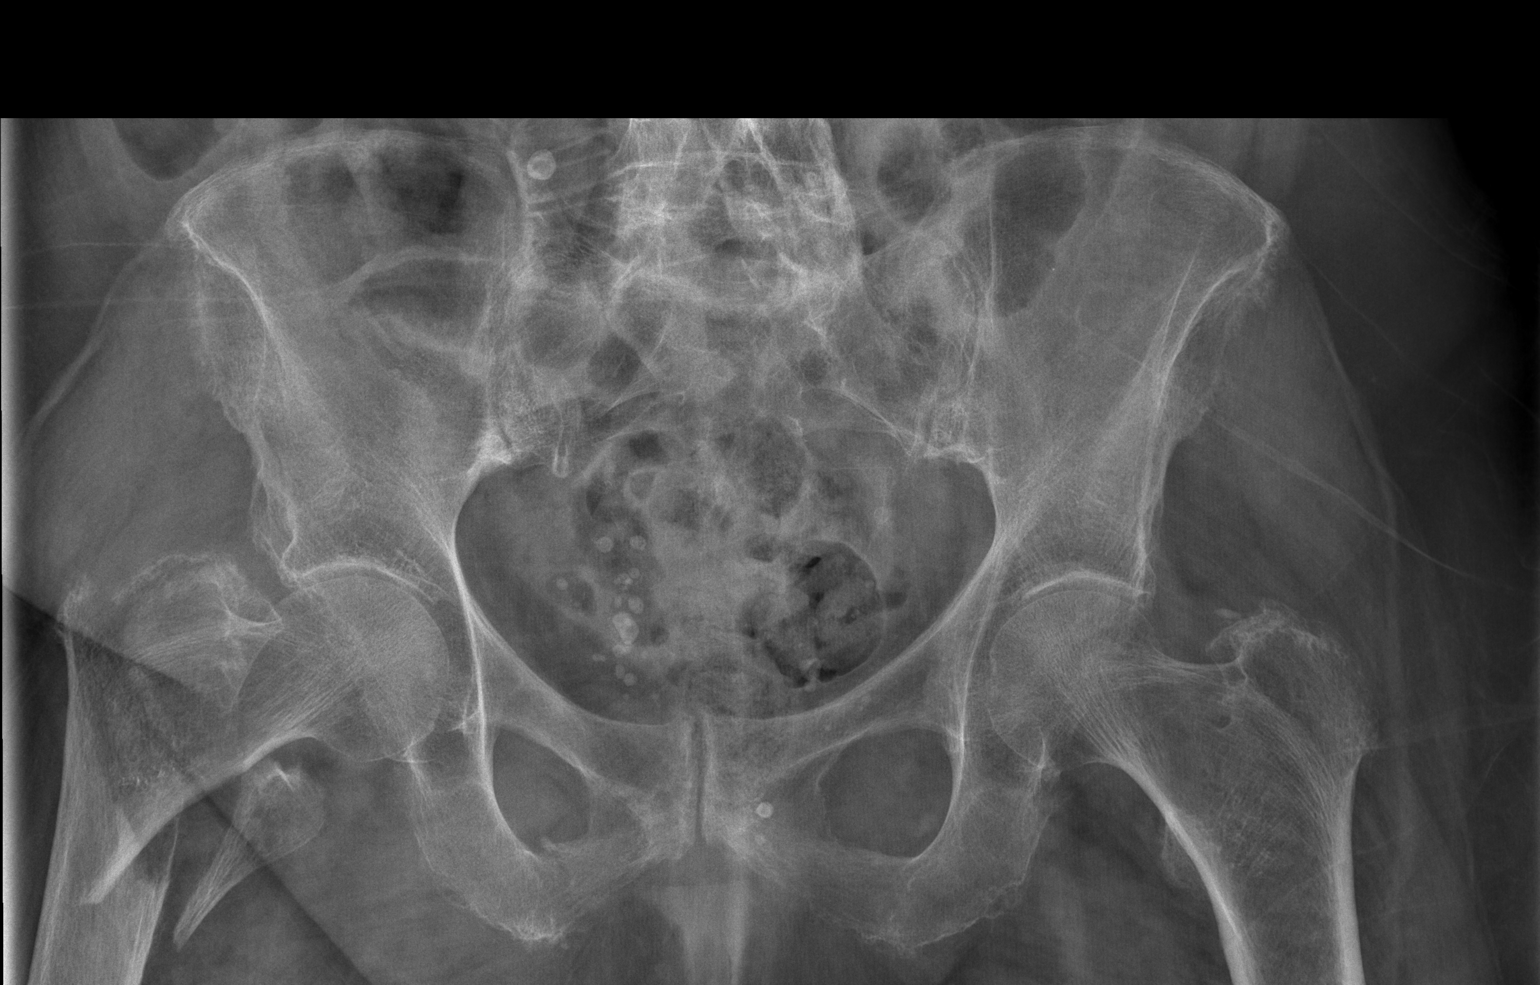

[t hip ap right]
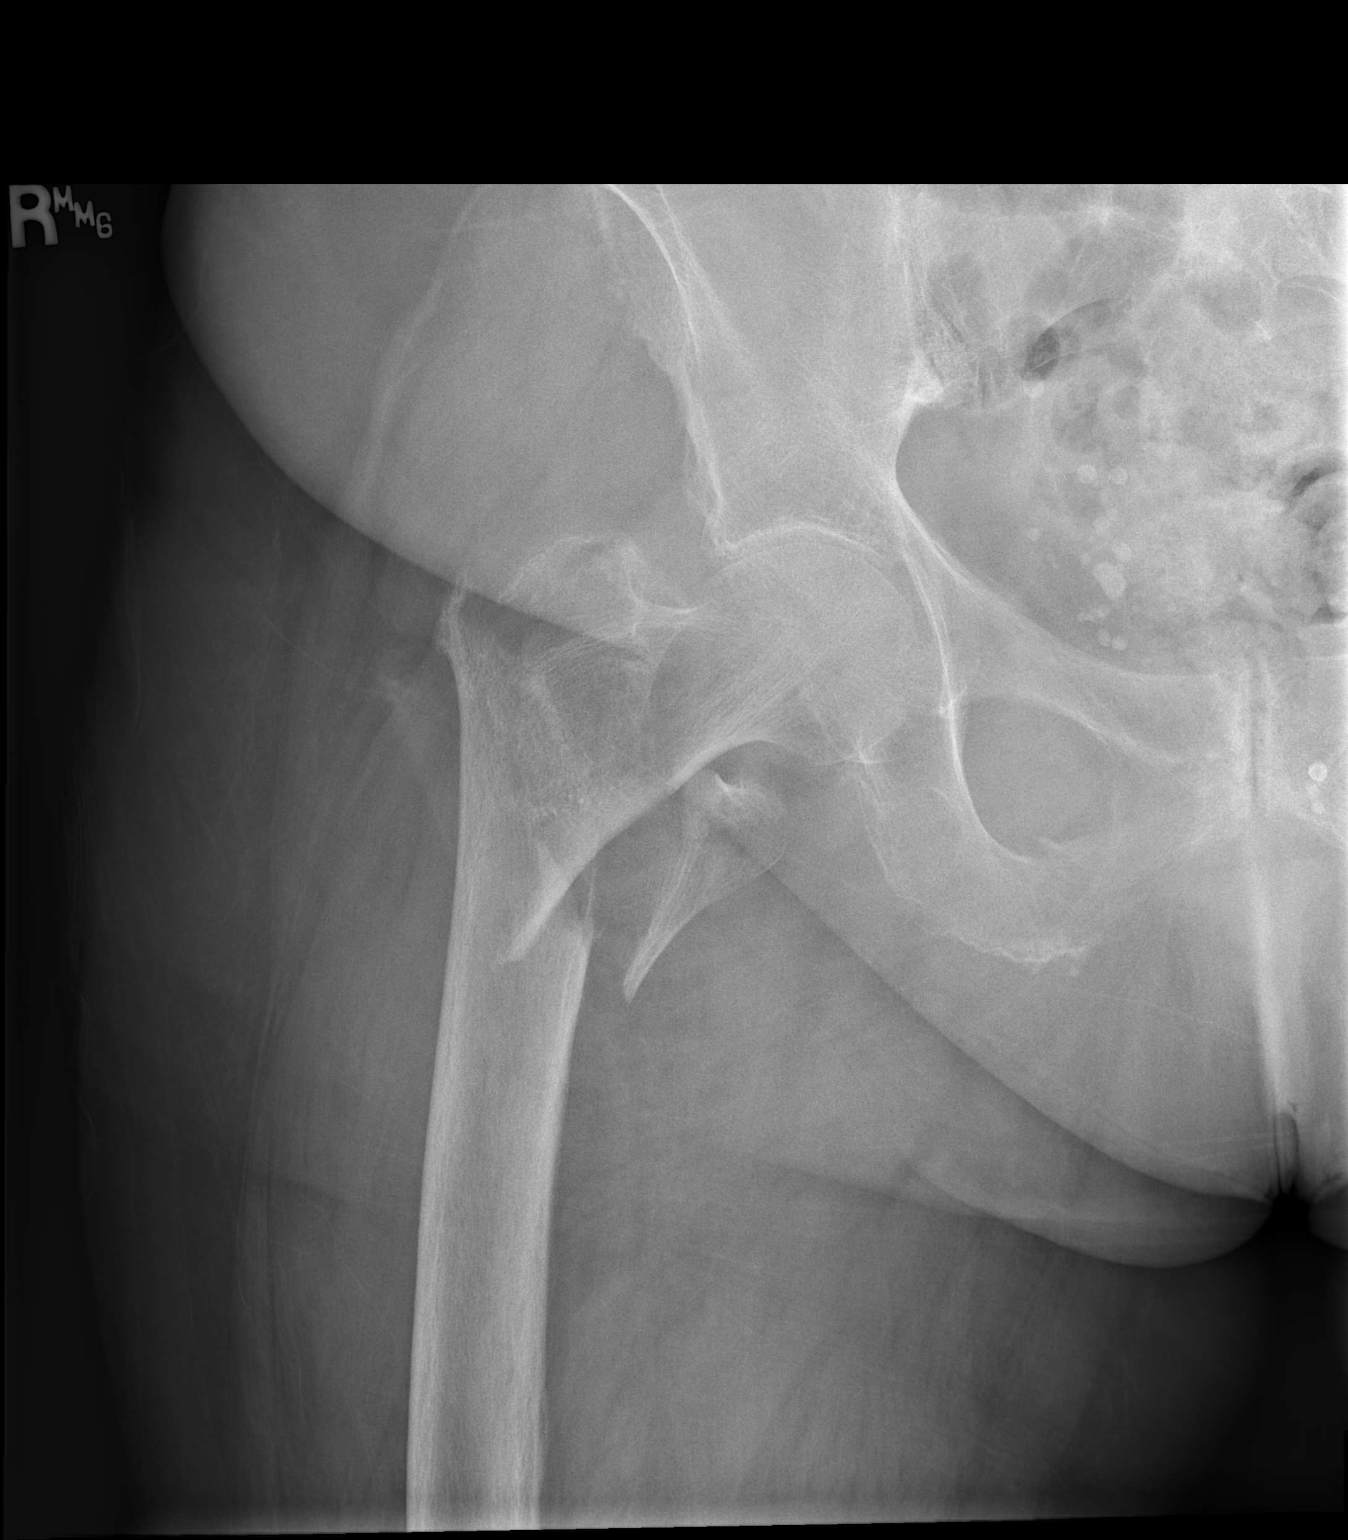

[t hip frog leg right]
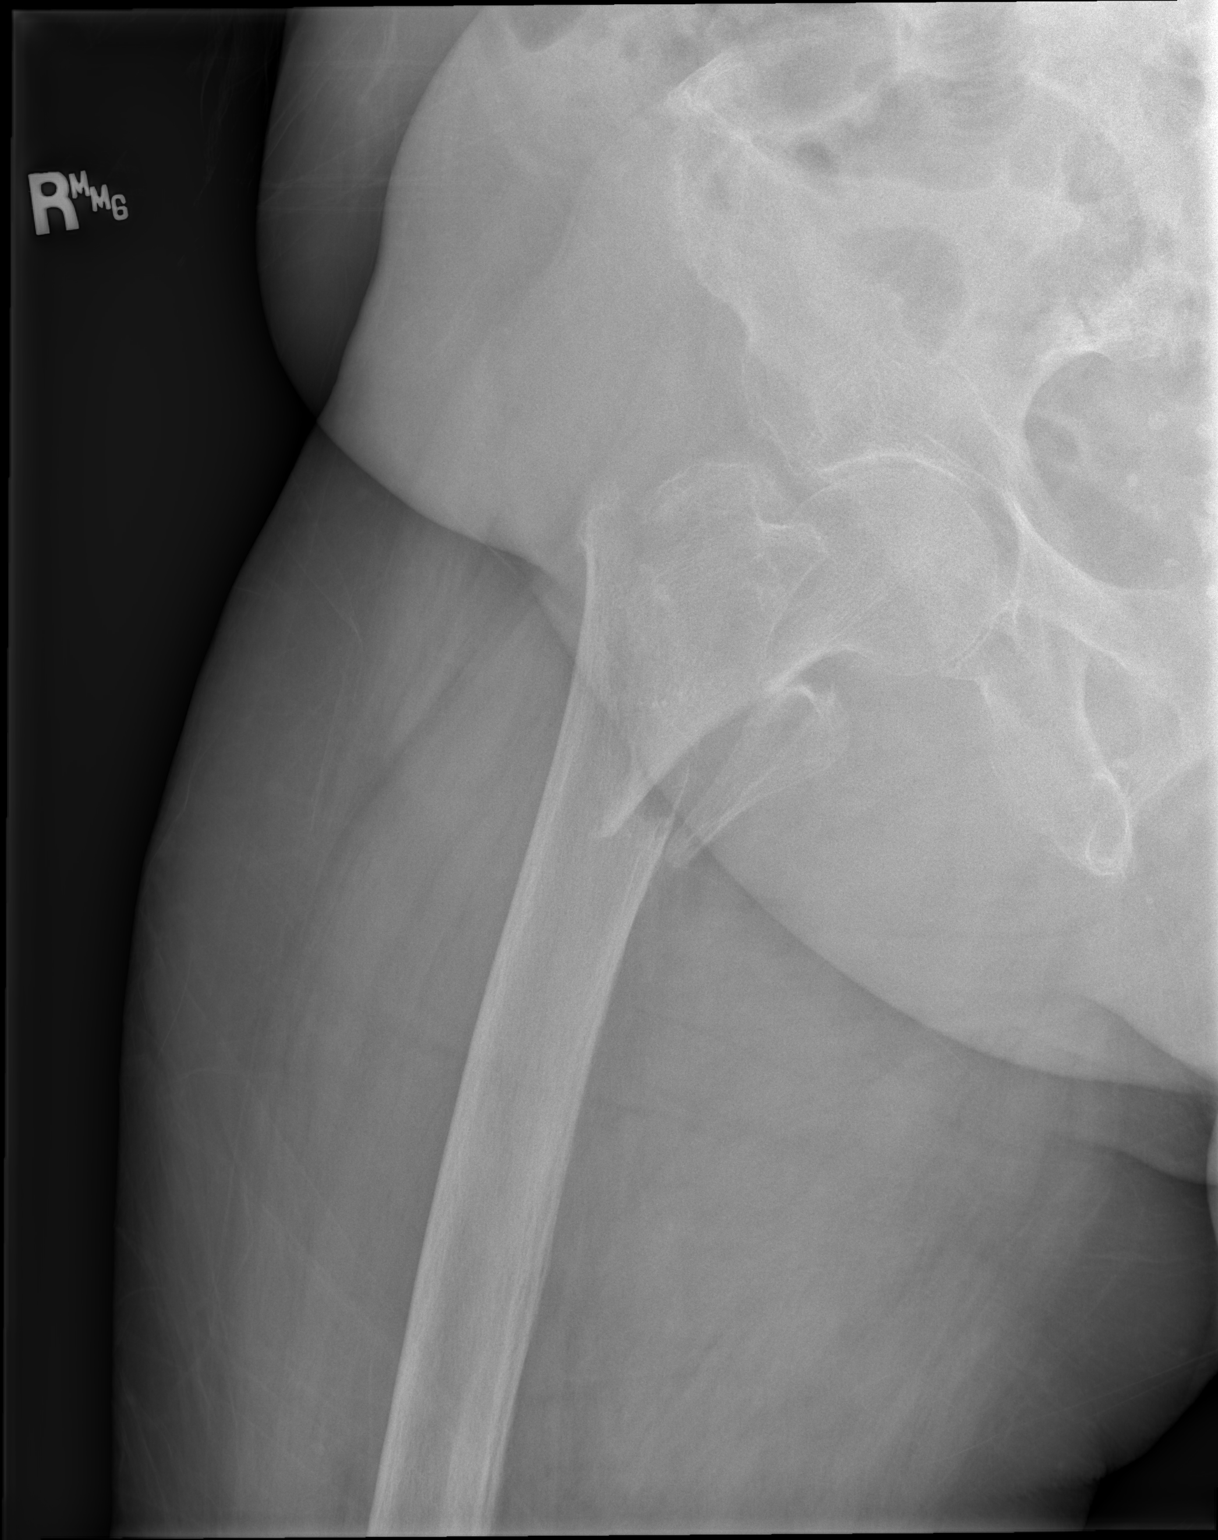

[3 of 3 positions shown; findings below may reference images not displayed]

FINDINGS: Moderate osteopenia. Degenerative irregularity of the symphysis
pubis.

Comminuted inter trochanteric right femur fracture, with separate
lesser trochanteric fracture fragment.

Possible minimally displaced right inferior pubic ramus fracture.
IMPRESSION: Comminuted intertrochanteric right femur fracture.

Possible right inferior pubic ramus minimally displaced fracture.

## 2014-05-08 IMAGING — CR DG FEMUR 2+V PORT*R*
1 series · 1 of 1 positions shown · non-contrast
Comparison: None.

CLINICAL DATA: Lateral view of the femoral shaft

EXAM:
PORTABLE RIGHT FEMUR - 2 VIEW.  The patient refused the AP view

[xtable lateral]
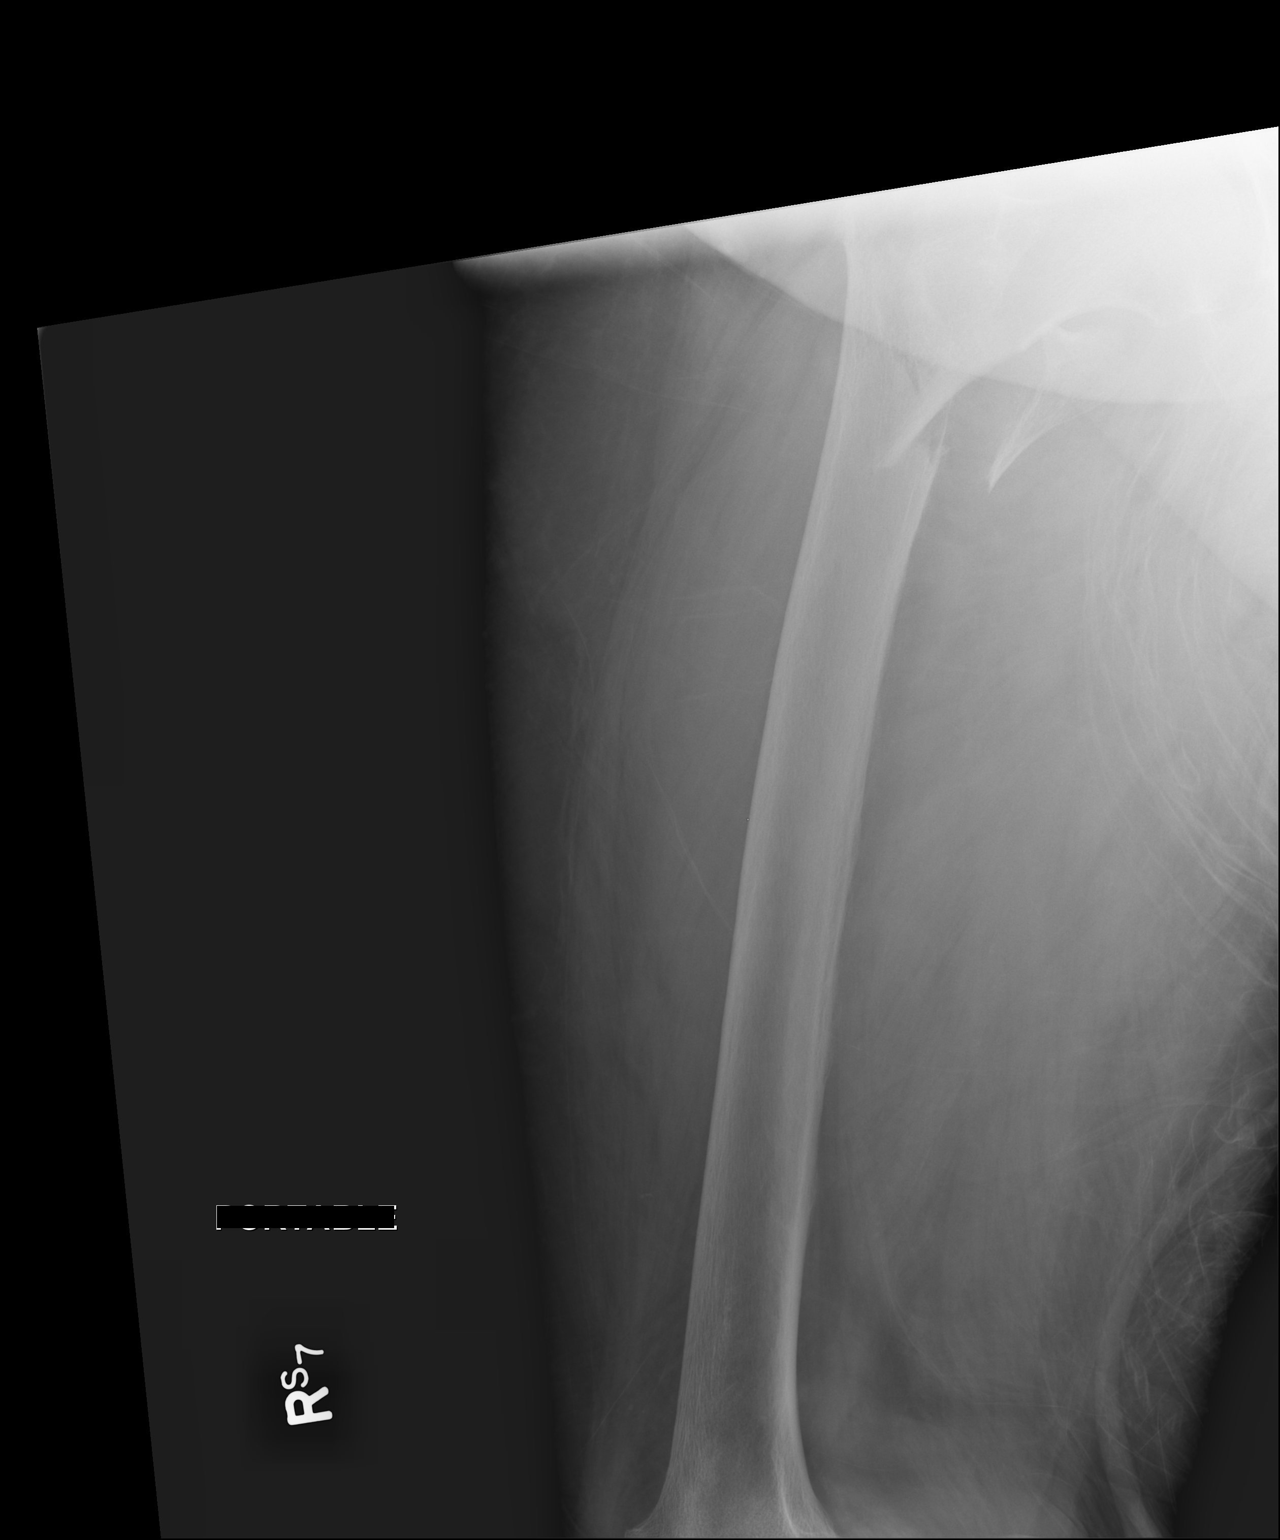

[1 of 1 positions shown; findings below may reference images not displayed]

FINDINGS: There is a fracture of the intertrochanteric -sub trochanteric
region of the right femur. Avulsion of the lesser trochanter is
demonstrated. The femoral head and neck cannot be adequately
assessed on this single film. The femoral diaphysis appears intact
where visualized.
IMPRESSION: This is a very limited single view exam. At the superior margin of
the study partially imaged is a comminuted angulated
intertrochanteric-sub trochanteric fracture of the proximal right
femur with avulsion of the lesser trochanter.

## 2014-07-30 IMAGING — DX DG CHEST 1V PORT
1 series · 1 of 1 positions shown · non-contrast
Comparison: 01/02/2013

CLINICAL DATA: Shortness of breath and weakness

EXAM:
PORTABLE CHEST - 1 VIEW

[portable]
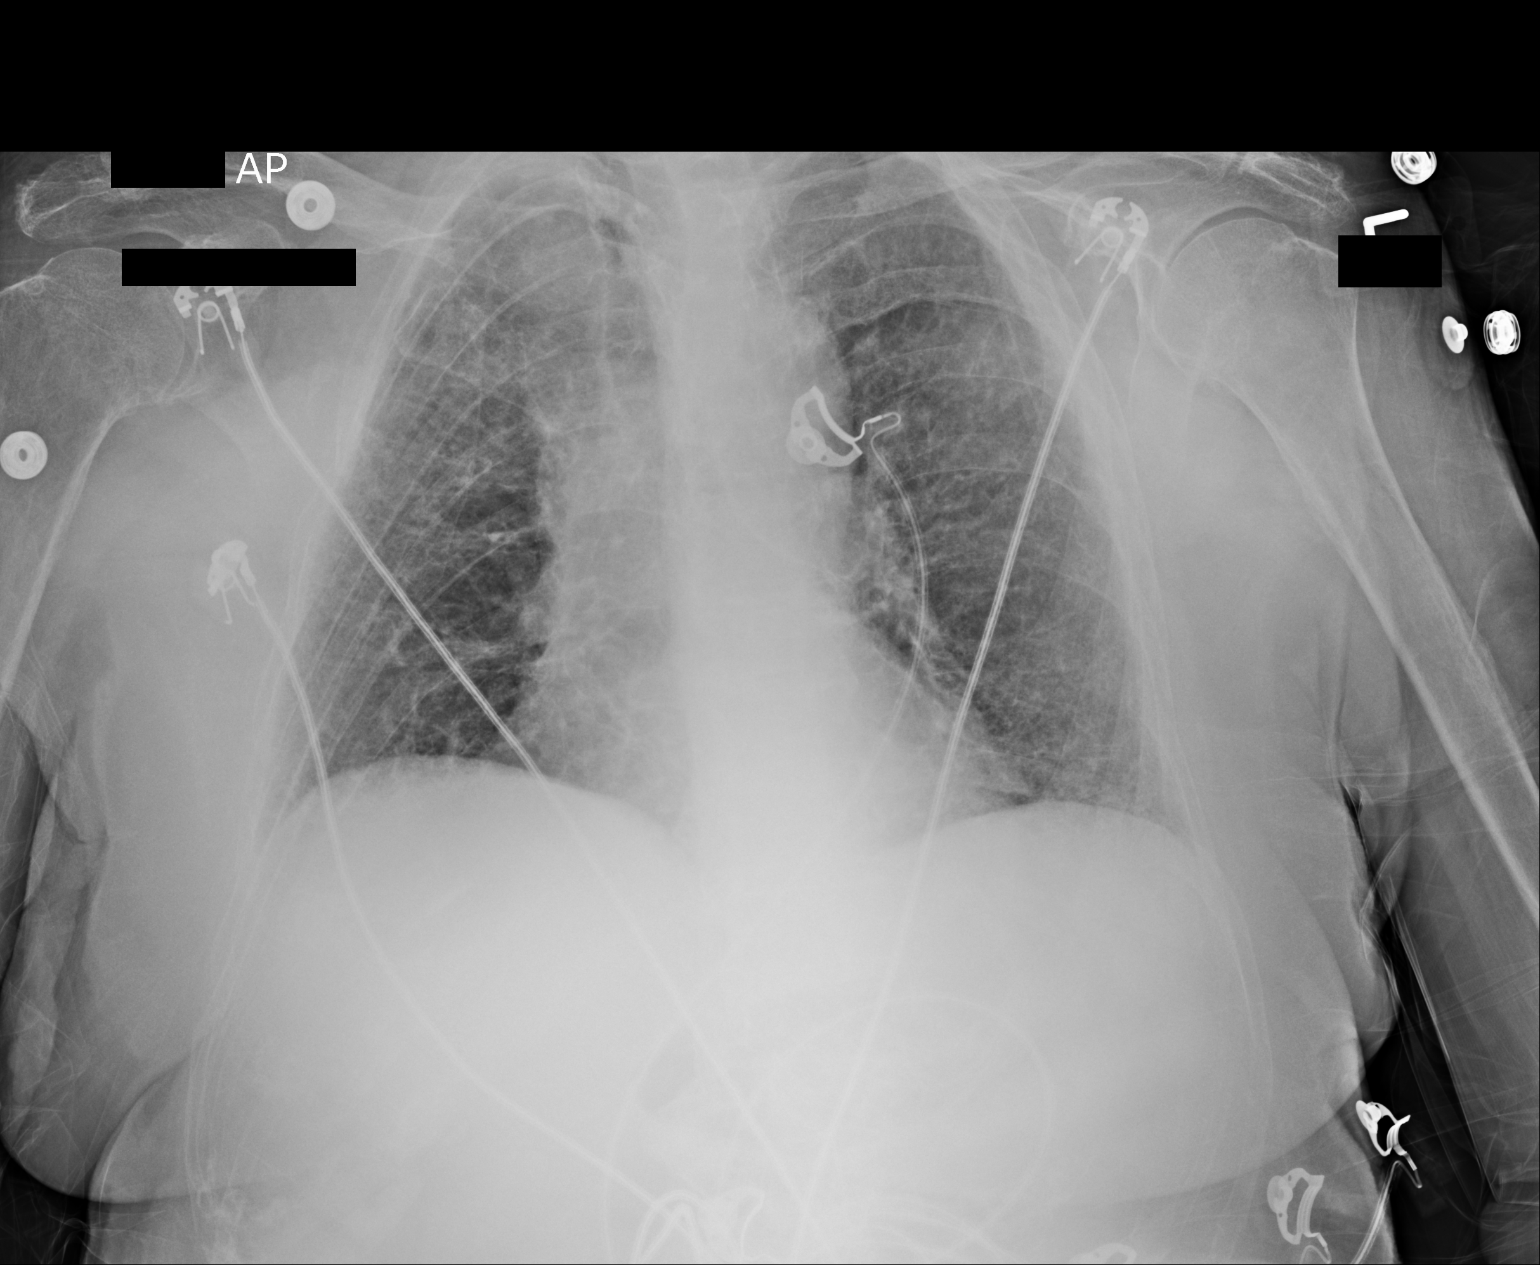

[1 of 1 positions shown; findings below may reference images not displayed]

FINDINGS: Fine and coarse interstitial opacities diffusely. Lung volumes
appear decreased. No cardiomegaly. Limited assessment of the hila
and upper mediastinal contours due to rightward rotation. No
evidence of acute superimposed disease, including edema or
consolidation. No effusion or pneumothorax.
IMPRESSION: Interstitial lung disease without evidence of acute superimposed
process.
